# Patient Record
Sex: Female | Born: 1980 | Hispanic: Yes | Marital: Single | Smoking: Never smoker
Health system: Southern US, Community
[De-identification: ages and names within clinical notes are randomized; demographics above are authoritative.]

## PROBLEM LIST (undated history)

## (undated) DIAGNOSIS — N83209 Unspecified ovarian cyst, unspecified side: Secondary | ICD-10-CM

## (undated) HISTORY — PX: CHOLECYSTECTOMY: SHX55

---

## 2004-12-03 ENCOUNTER — Ambulatory Visit: Payer: Self-pay | Admitting: Family Medicine

## 2005-01-08 ENCOUNTER — Ambulatory Visit: Payer: Self-pay | Admitting: Family Medicine

## 2005-01-27 ENCOUNTER — Observation Stay: Payer: Self-pay

## 2005-06-06 ENCOUNTER — Inpatient Hospital Stay: Payer: Self-pay

## 2005-06-08 IMAGING — US US OB US >=[ID] SNGL FETUS
1 series · 14 of 28 positions shown · non-contrast
Comparison: none

REASON FOR EXAM: Anatomy and Growth
COMMENTS:

[Series 1: us ob us >=(id) sngl fetus · 0.39mm/px · 14 of 66 slices shown]
[im 3/66]
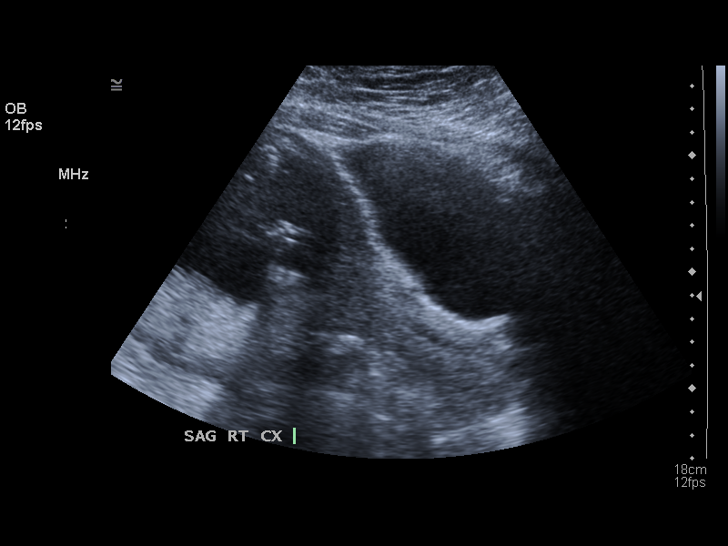
[im 8/66]
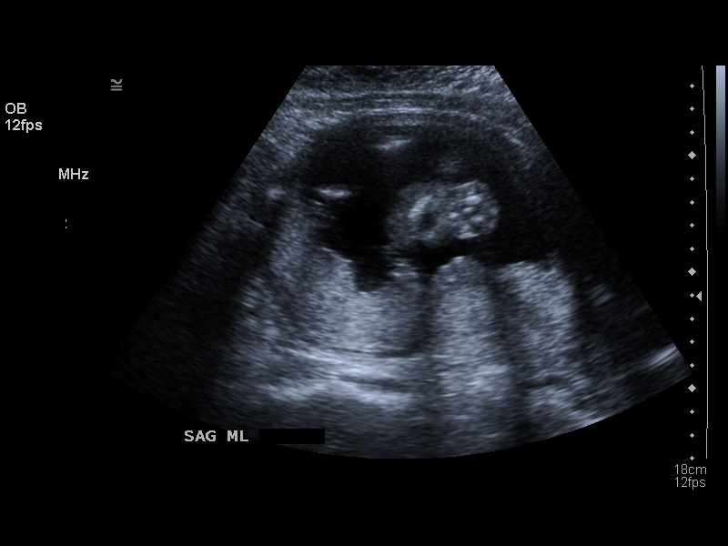
[im 13/66]
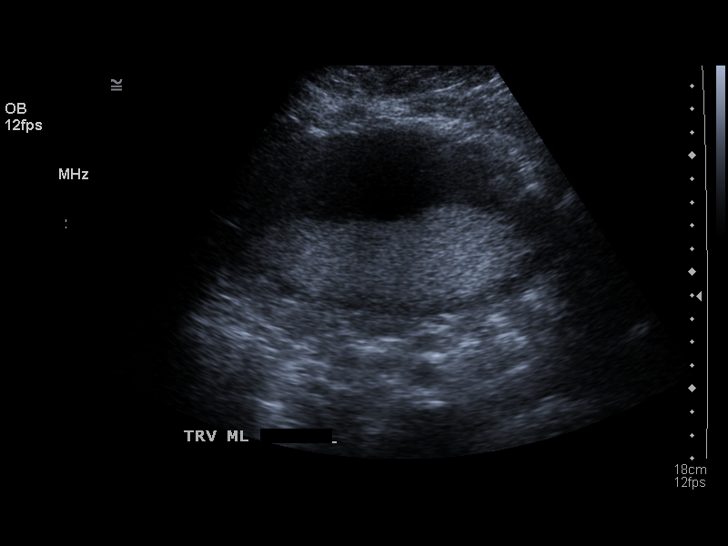
[im 17/66]
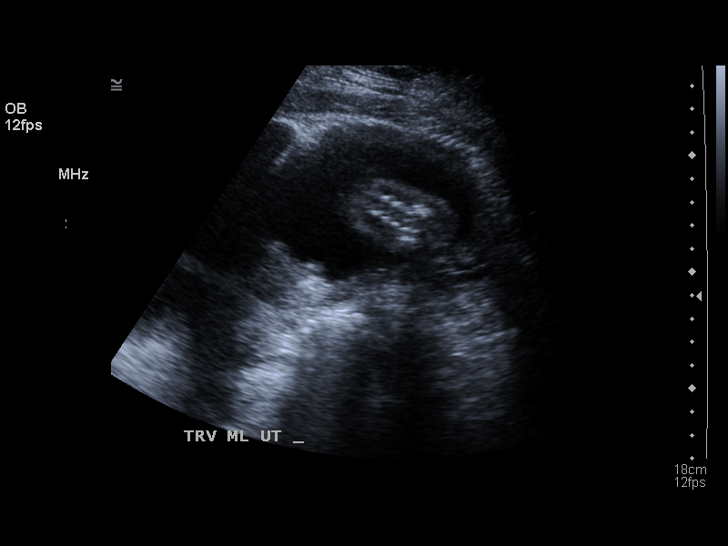
[im 22/66]
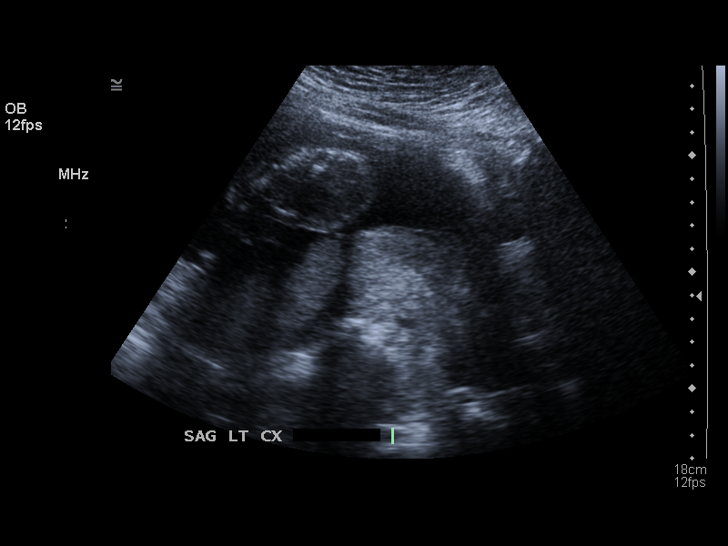
[im 27/66]
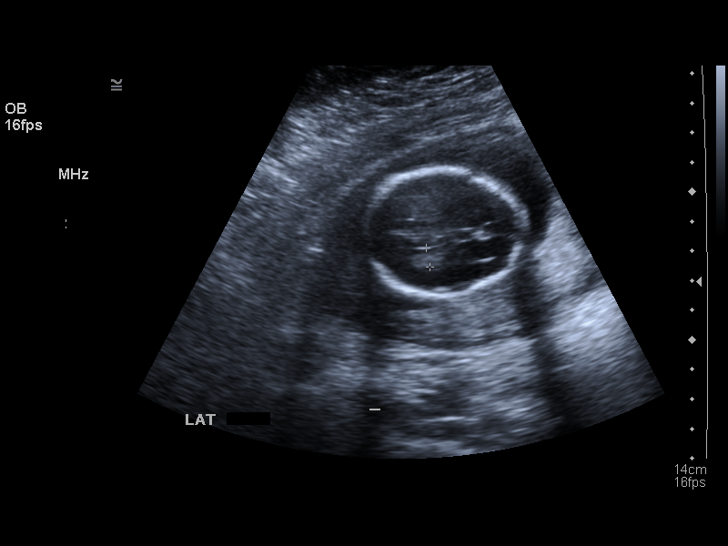
[im 32/66]
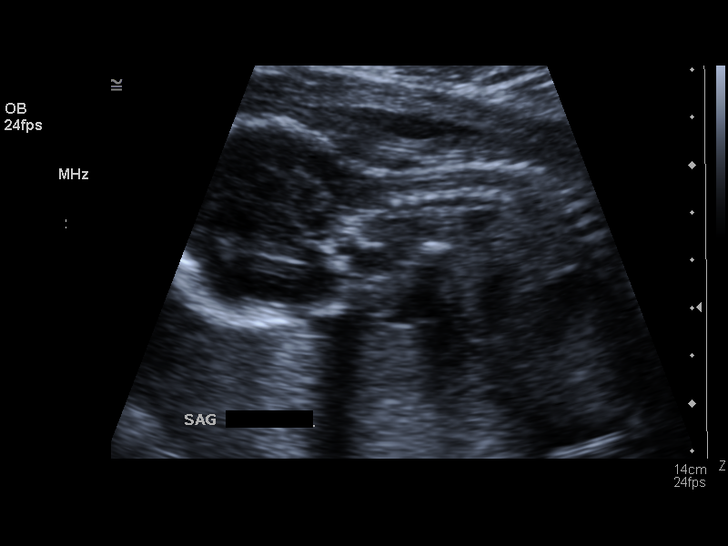
[im 37/66]
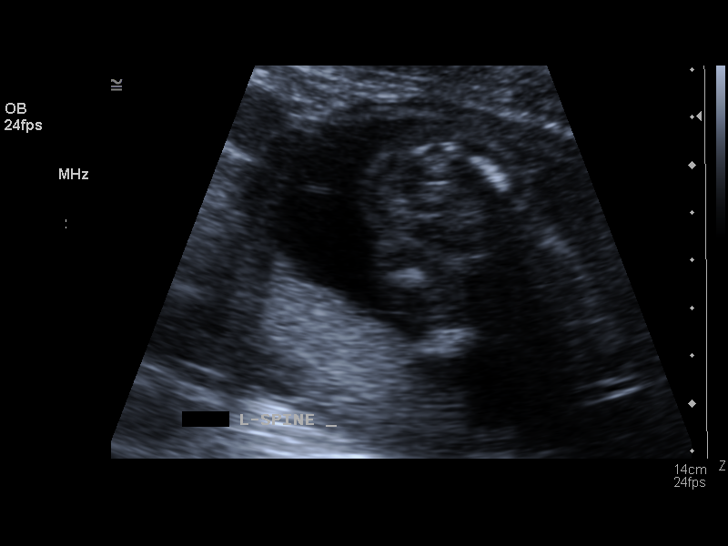
[im 41/66]
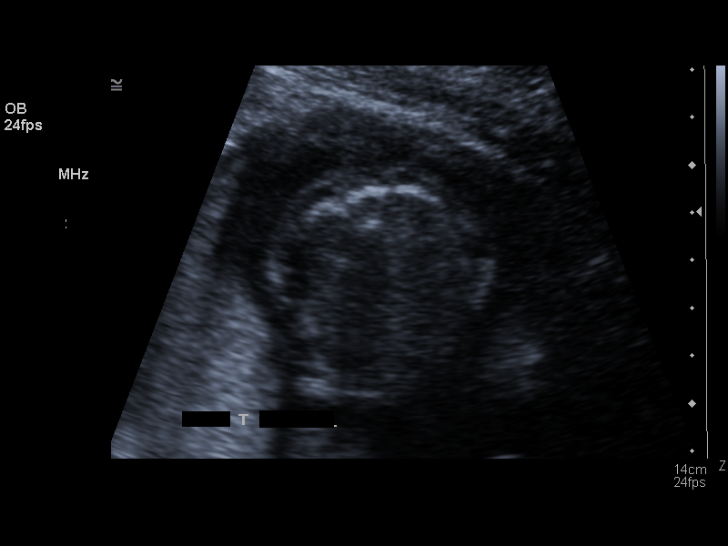
[im 46/66]
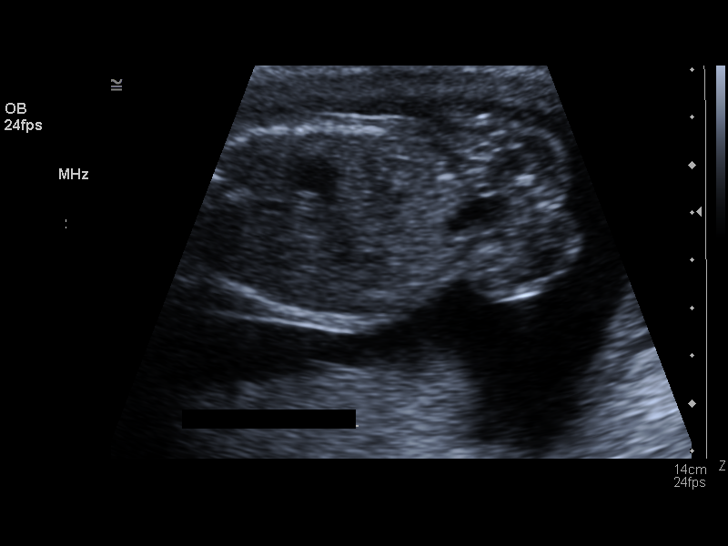
[im 51/66]
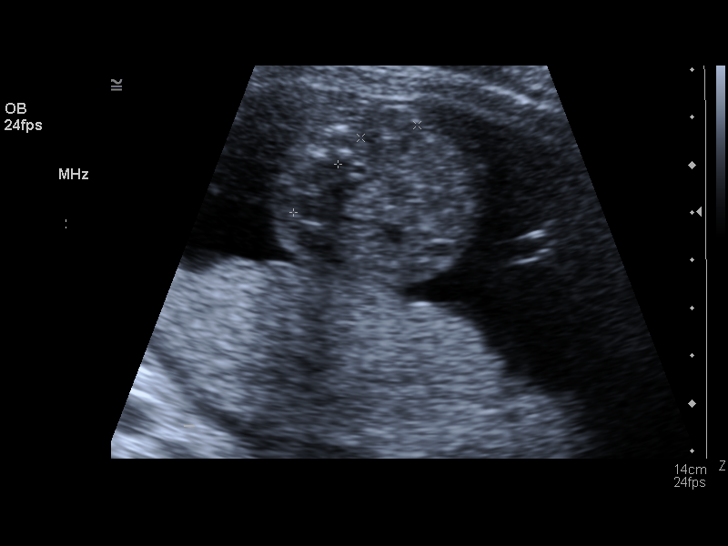
[im 56/66]
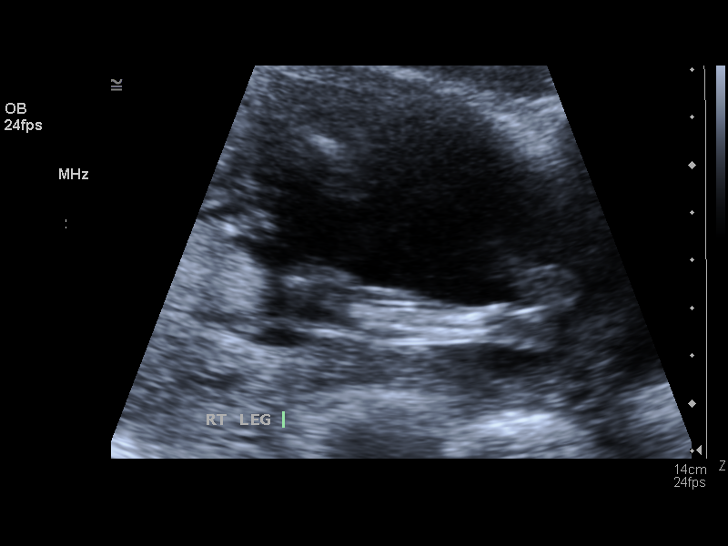
[im 61/66]
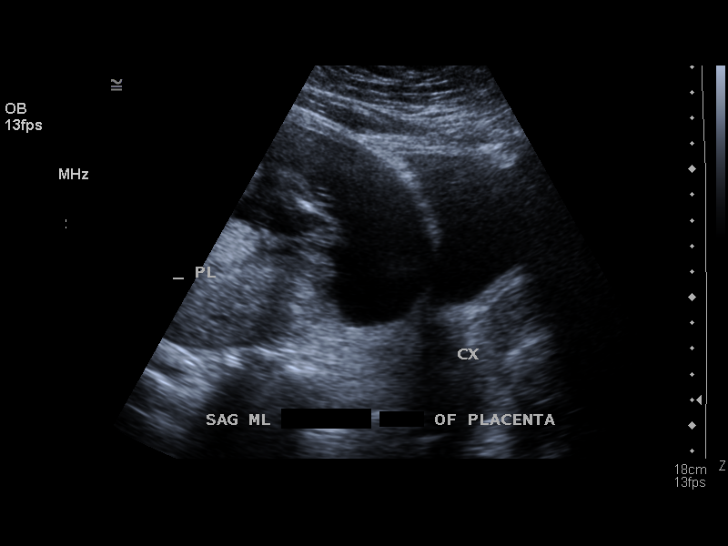
[im 66/66]
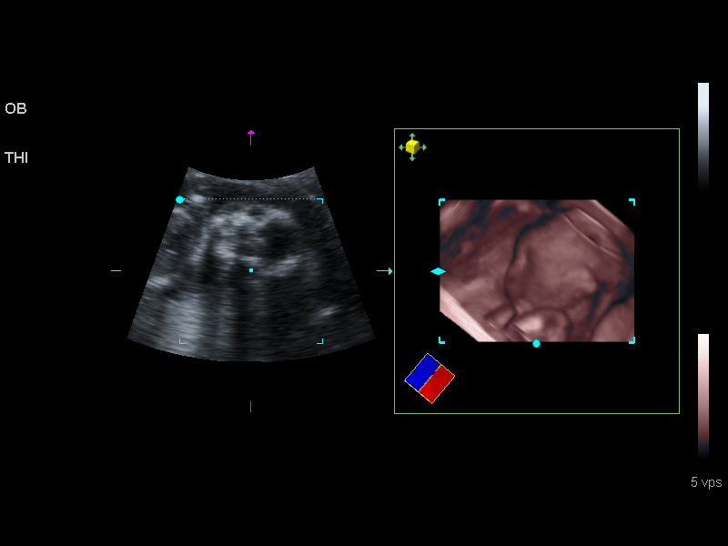

[14 of 28 positions shown; findings below may reference images not displayed]

PROCEDURE:     US  - US PREGNANCY / FETAL AGE  - January 08, 2005 [DATE]

RESULT:        Fetal parts of a single viable fetus are visualized.
Presentation is variable during this exam.  The placenta is posterior.
Fetal heart rate was monitored at 144 beats per minute.  The fetal heart,
stomach and urinary bladder are visualized.  Amniotic fluid volume appears
normal.  Fetal measurements are as follows:

BPD          44.3 mm     19 weeks 3 days
HC               161.6 mm     19 weeks 0 days
AC               140.5 mm     19 weeks 3 days
FL                31.8 mm     19 weeks 6 days
EFW equal 301 grams.  Average ultrasound age 19 weeks 3 days.  Ultrasound
EDD 06/01/05.
IMPRESSION: Please see above.

## 2009-09-25 ENCOUNTER — Emergency Department: Payer: Self-pay | Admitting: Emergency Medicine

## 2009-09-27 ENCOUNTER — Ambulatory Visit: Payer: Self-pay | Admitting: Surgery

## 2011-09-26 ENCOUNTER — Ambulatory Visit: Payer: Self-pay | Admitting: Family Medicine

## 2012-03-09 ENCOUNTER — Inpatient Hospital Stay: Payer: Self-pay

## 2012-03-09 LAB — CBC WITH DIFFERENTIAL/PLATELET
Basophil #: 0 10*3/uL (ref 0.0–0.1)
Eosinophil %: 0.9 %
HCT: 42.7 % (ref 35.0–47.0)
HGB: 14.5 g/dL (ref 12.0–16.0)
MCH: 30.3 pg (ref 26.0–34.0)
MCHC: 34 g/dL (ref 32.0–36.0)
Monocyte #: 0.8 x10 3/mm (ref 0.2–0.9)
Monocyte %: 7.7 %
Neutrophil #: 7.6 10*3/uL — ABNORMAL HIGH (ref 1.4–6.5)
Neutrophil %: 69.7 %
WBC: 10.9 10*3/uL (ref 3.6–11.0)

## 2012-03-10 LAB — HEMATOCRIT: HCT: 39.5 % (ref 35.0–47.0)

## 2015-03-21 NOTE — H&P (Signed)
L&D Evaluation:  History:   HPI [id number] nwith LMP of 05/17/11 & EDd of 02/21/12 here for "UC's becoming more uncomfortable and sent [facility]ida [facility] Pt initially was 4 cms in the office and now  is 6 cm's progressing over the past 2 hours. ROM has not occurred. PNC at [facility] significant for Depression, ASCUS pap, Obesity. No VB or decreased FM noted.    Presents with contractions    Patient's Medical History Depression, ASCUS, Obesity, Bialt ovarian masses 2004, Trich x 1    Patient's Surgical History none    Medications Pre [place] Vitamins    Social History former smoker    Family History Non-Contributory   ROS:   ROS All systems were reviewed.  HEENT, CNS, GI, GU, Respiratory, CV, Renal and Musculoskeletal systems were found to be normal.   Exam:   Vital Signs stable    General no apparent distress    Mental Status clear    Chest clear    Heart normal sinus rhythm, no murmur/gallop/rubs    Abdomen gravid, tender with contractions    Estimated Fetal Weight Average for gestational age    Back no CVAT    Edema 1+    Reflexes 1+    Clonus negative    Pelvic 6/100/vtx    Mebranes Intact    FHT normal rate with no decels, + accels, minimal variability now    Ucx irregular, UC's are not showing    Skin dry    Lymph no lymphadenopathy   Impression:   Impression active labor, IUP at 40 4/7 weeks with active labor   Plan:   Plan monitor contractions and for cervical change   Electronic Signatures: Catheryn Bacon (CNM)  (Signed 29-Apr-13 12:41)  Authored: L&D Evaluation   Last Updated: 29-Apr-13 12:41 by Catheryn Bacon (CNM)

## 2015-07-06 ENCOUNTER — Emergency Department: Payer: Self-pay

## 2015-07-06 ENCOUNTER — Encounter: Payer: Self-pay | Admitting: Emergency Medicine

## 2015-07-06 ENCOUNTER — Emergency Department
Admission: EM | Admit: 2015-07-06 | Discharge: 2015-07-06 | Disposition: A | Discharge status: Home / Self Care | Payer: Self-pay | Attending: Emergency Medicine | Admitting: Emergency Medicine

## 2015-07-06 DIAGNOSIS — D271 Benign neoplasm of left ovary: Secondary | ICD-10-CM

## 2015-07-06 DIAGNOSIS — Z3202 Encounter for pregnancy test, result negative: Secondary | ICD-10-CM | POA: Insufficient documentation

## 2015-07-06 DIAGNOSIS — Z9049 Acquired absence of other specified parts of digestive tract: Secondary | ICD-10-CM | POA: Insufficient documentation

## 2015-07-06 DIAGNOSIS — D270 Benign neoplasm of right ovary: Secondary | ICD-10-CM

## 2015-07-06 DIAGNOSIS — R1032 Left lower quadrant pain: Secondary | ICD-10-CM

## 2015-07-06 DIAGNOSIS — D279 Benign neoplasm of unspecified ovary: Secondary | ICD-10-CM | POA: Insufficient documentation

## 2015-07-06 LAB — CBC WITH DIFFERENTIAL/PLATELET
Basophils Absolute: 0.1 10*3/uL (ref 0–0.1)
Basophils Relative: 1 %
Eosinophils Absolute: 0.2 10*3/uL (ref 0–0.7)
Eosinophils Relative: 2 %
HEMATOCRIT: 40.1 % (ref 35.0–47.0)
HEMOGLOBIN: 13.4 g/dL (ref 12.0–16.0)
Lymphocytes Relative: 33 %
Lymphs Abs: 3.7 10*3/uL — ABNORMAL HIGH (ref 1.0–3.6)
MCH: 28.5 pg (ref 26.0–34.0)
MCHC: 33.4 g/dL (ref 32.0–36.0)
MCV: 85.2 fL (ref 80.0–100.0)
MONOS PCT: 6 %
Monocytes Absolute: 0.6 10*3/uL (ref 0.2–0.9)
NEUTROS ABS: 6.4 10*3/uL (ref 1.4–6.5)
NEUTROS PCT: 58 %
Platelets: 314 10*3/uL (ref 150–440)
RBC: 4.7 MIL/uL (ref 3.80–5.20)
RDW: 12.4 % (ref 11.5–14.5)
WBC: 11 10*3/uL (ref 3.6–11.0)

## 2015-07-06 LAB — URINALYSIS COMPLETE WITH MICROSCOPIC (ARMC ONLY)
BACTERIA UA: NONE SEEN
Bilirubin Urine: NEGATIVE
GLUCOSE, UA: NEGATIVE mg/dL
LEUKOCYTES UA: NEGATIVE
NITRITE: NEGATIVE
Protein, ur: NEGATIVE mg/dL
SPECIFIC GRAVITY, URINE: 1.014 (ref 1.005–1.030)
pH: 6 (ref 5.0–8.0)

## 2015-07-06 LAB — COMPREHENSIVE METABOLIC PANEL
ALBUMIN: 4.5 g/dL (ref 3.5–5.0)
ALK PHOS: 60 U/L (ref 38–126)
ALT: 14 U/L (ref 14–54)
AST: 28 U/L (ref 15–41)
Anion gap: 12 (ref 5–15)
BILIRUBIN TOTAL: 0.8 mg/dL (ref 0.3–1.2)
BUN: 10 mg/dL (ref 6–20)
CALCIUM: 9.4 mg/dL (ref 8.9–10.3)
CO2: 22 mmol/L (ref 22–32)
CREATININE: 0.56 mg/dL (ref 0.44–1.00)
Chloride: 106 mmol/L (ref 101–111)
GFR calc non Af Amer: >60 mL/min (ref >60)
GLUCOSE: 107 mg/dL — AB (ref 65–99)
Potassium: 3.4 mmol/L — ABNORMAL LOW (ref 3.5–5.1)
Sodium: 140 mmol/L (ref 135–145)
Total Protein: 8.1 g/dL (ref 6.5–8.1)

## 2015-07-06 LAB — WET PREP, GENITAL
Clue Cells Wet Prep HPF POC: NONE SEEN
TRICH WET PREP: NONE SEEN

## 2015-07-06 LAB — CHLAMYDIA/NGC RT PCR (ARMC ONLY)
Chlamydia Tr: NOT DETECTED
N gonorrhoeae: NOT DETECTED

## 2015-07-06 LAB — HCG, QUANTITATIVE, PREGNANCY: hCG, Beta Chain, Quant, S: 1 m[IU]/mL (ref <5)

## 2015-07-06 MED ORDER — SODIUM CHLORIDE 0.9 % IV BOLUS (SEPSIS)
1000.0000 mL | Freq: Once | INTRAVENOUS | Status: AC
  Administered 2015-07-06: 1000 mL via INTRAVENOUS

## 2015-07-06 MED ORDER — HYDROMORPHONE HCL 1 MG/ML IJ SOLN
INTRAMUSCULAR | Status: AC
  Administered 2015-07-06: 1 mg via INTRAVENOUS
  Filled 2015-07-06: qty 1

## 2015-07-06 MED ORDER — IOHEXOL 240 MG/ML SOLN
25.0000 mL | Freq: Once | INTRAMUSCULAR | Status: AC | PRN
  Administered 2015-07-06: 25 mL via ORAL

## 2015-07-06 MED ORDER — MORPHINE SULFATE (PF) 4 MG/ML IV SOLN
INTRAVENOUS | Status: AC
  Administered 2015-07-06: 4 mg via INTRAVENOUS
  Filled 2015-07-06: qty 1

## 2015-07-06 MED ORDER — NAPROXEN 500 MG PO TABS: 500.0000 mg | ORAL_TABLET | Freq: Two times a day (BID) | ORAL | Status: DC

## 2015-07-06 MED ORDER — ONDANSETRON HCL 4 MG/2ML IJ SOLN: 4.0000 mg | Freq: Once | INTRAMUSCULAR | Status: DC

## 2015-07-06 MED ORDER — ONDANSETRON HCL 4 MG/2ML IJ SOLN
INTRAMUSCULAR | Status: AC
  Filled 2015-07-06: qty 2

## 2015-07-06 MED ORDER — MORPHINE SULFATE (PF) 4 MG/ML IV SOLN
4.0000 mg | Freq: Once | INTRAVENOUS | Status: AC
  Administered 2015-07-06: 4 mg via INTRAVENOUS

## 2015-07-06 MED ORDER — IOHEXOL 300 MG/ML  SOLN
100.0000 mL | Freq: Once | INTRAMUSCULAR | Status: AC | PRN
  Administered 2015-07-06: 100 mL via INTRAVENOUS

## 2015-07-06 MED ORDER — PROMETHAZINE HCL 25 MG PO TABS: 25.0000 mg | ORAL_TABLET | Freq: Four times a day (QID) | ORAL | Status: DC | PRN

## 2015-07-06 MED ORDER — HYDROMORPHONE HCL 1 MG/ML IJ SOLN
1.0000 mg | Freq: Once | INTRAMUSCULAR | Status: AC
  Administered 2015-07-06: 1 mg via INTRAVENOUS

## 2015-07-06 NOTE — Discharge Instructions (Signed)
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 You will need to have your primary care physician follow-up on this with a likely MRI.  Follow up with gynecology if you are unable to see your primary care doctor or your symptoms do not improve.  Your CT scan does not show any other significant concerns.   Dolor abdominal en las mujeres (Abdominal Pain, Women) El dolor abdominal (en el estmago, la pelvis o el vientre) puede tener muchas causas. Es importante que le informe a su mdico:  La ubicacin del Social research officer, government.  Viene y se va, o persiste todo el tiempo?  Hay situaciones que English as a second language teacher (comer ciertos alimentos, la actividad fsica)?  Tiene otros sntomas asociados al dolor (fiebre, nuseas, vmitos, diarrea)? Todo es de gran ayuda cuando se trata de hallar la causa del dolor. CAUSAS  Estmago: Infecciones por virus o bacterias, o lcera.  Intestino: Apendicitis (apndice inflamado), ileitis regional (enfermedad de Crohn), colitis ulcerosa (colon inflamado), sndrome del colon irritable, diverticulitis (inflamacin de los divertculos del colon) o cncer de estmago oo intestino.  Enfermedades de la vescula biliar o clculos.  Enfermedades renales, clculos o infecciones en el rin.  Infeccin o cncer del pncreas.  Fibromialgia (trastorno doloroso)  Enfermedades de los rganos femeninos:  Uterus: tero: fibroma (tumor no canceroso) o infeccin  Trompas de [place]: infeccin o embarazo ectpico  En los ovarios, quistes o tumores.  Adherencias plvicas (tejido cicatrizal).  Endometriosis (el tejido que cubre el tero se desarrolla en la pelvis y los rganos plvicos).  Sndrome de Occupational psychologist (los rganos femeninos se llenan de sangre antes del periodo menstrual(  Dolor durante el periodo menstrual.  Dolor durante la ovulacin (al producir vulos).  Dolor al usar el DIU (dispositivo intrauterino para el control de la natalidad)  Administrator, arts los rganos femeninos.  Dolor funcional (no est originado en una enfermedad, puede mejorar sin tratamiento).  Dolor de origen psicolgico  Depresin. DIAGNSTICO Su mdico decidir la gravedad del dolor a travs del examen fsico  Anlisis de sangre  Radiografas  Ecografas  TC (tomografa computada, tipo especial de radiografas).  IMR (resonancia magntica)  Cultivos, en el caso una infeccin  Colon por enema de bario (se inserta una sustancia de contraste en el intestino grueso para mejorar la observacin con rayos X.)  Colonoscopa (observacin del intestino con un tubo luminoso).  Laparoscopa (examen del interior del abdomen con un tubo que tiene [employer]).  Ciruga exploratoria abdominal mayor (se observa el abdomen realizando una gran incisin). TRATAMIENTO El tratamiento depender de la causa del problema.   Muchos de estos casos pueden controlarse y tratarse en casa.  Medicamentos de venta libre indicados por el mdico.  Medicamentos con receta.  Antibiticos, en caso de infeccin  Pldoras anticonceptivas, en el caso de perodos dolorosos o dolor al ovular.  Tratamiento hormonal, para la endometriosis  Inyecciones para bloqueo nervioso selectivo.  Fisioterapia.  Antidepresivos.  Consejos por parte de un psclogo o psiquiatra.  Ciruga mayor o menor. INSTRUCCIONES PARA EL CUIDADO DOMICILIARIO  No tome ni administre laxantes a menos que se lo haya indicado su mdico.  Tome analgsicos de venta libre slo si se lo ha indicado el profesional que lo asiste. No tome aspirina, ya que puede causar [employer] o hemorragias.  Consuma una dieta lquida (caldo o agua) segn lo indicado por el mdico. Progrese lentamente a una dieta blanda, segn la tolerancia, si el dolor se relaciona con el estmago o el  intestino.  Tenga un termmetro y tmese la temperatura varias veces al da.  Haga reposo en la cama y [place], si esto Dealer.  Evite las relaciones sexuales, Higher education careers adviser.  Evite las situaciones estresantes.  Cumpla con las visitas y los anlisis de control, segn las indicaciones de su mdico.  Si el dolor no se [place] con los medicamentos o la Outlook, [place] tratar con:  Acupuntura.  Ejercicios de relajacin (yoga, meditacin).  Terapia grupal.  Psicoterapia. SOLICITE ATENCIN MDICA SI:  Nota que ciertos Writer de [place].  El tratamiento indicado para Lexicographer no Engineer, civil (consulting).  Necesita analgsicos ms fuertes.  Quiere que le retiren el DIU.  Si se siente confundido o desfalleciente.  Presenta nuseas o vmitos.  Aparece una erupcin cutnea.  Sufre efectos adversos o una reaccin alrgica debido a los medicamentos que toma. SOLICITE ATENCIN MDICA DE INMEDIATO SI:  El dolor persiste o se agrava.  Tiene fiebre.  Siente el dolor slo en algunos sectores del abdomen. Si se localiza en la zona derecha, posiblemente podra tratarse de apendicitis. En un adulto, si se localiza en la regin inferior izquierda del abdomen, podra tratarse de colitis o diverticulitis.  Hay sangre en las heces (deposiciones de color rojo brillante o negro alquitranado), con o sin vmitos.  Usted presenta sangre en la orina.  Siente escalofros con o sin fiebre.  Se desmaya. ASEGRESE QUE:   Comprende estas instrucciones.  Controlar su enfermedad.  Solicitar ayuda de inmediato si no mejora o si empeora. Document Released: 02/13/2009 Document Revised: 01/20/2012 [facility] Patient Information 2015 [place]. This information is not intended to replace advice given to you by your health care provider. Make sure you discuss any questions you have with your health care provider.  Quiste ovrico (Ovarian Cyst) Un quiste ovrico es una bolsa llena de lquido que se forma en el ovario. Los ovarios son los rganos pequeos que producen vulos en las  mujeres. Se pueden formar varios tipos de [employer]. Benito Mccreedy no son cancerosos. Muchos de ellos no causan problemas y con frecuencia desaparecen solos. Algunos pueden provocar sntomas y requerir Clinical research associate. Los tipos ms comunes de quistes ovricos son los siguientes:  Quistes funcionales: estos quistes pueden aparecer todos los meses durante el ciclo menstrual. Esto es normal. Estos quistes suelen desaparecer con el prximo ciclo menstrual si la mujer no queda embarazada. En general, los quistes funcionales no tienen sntomas.  Endometriomas: estos quistes se forman a partir del tejido que recubre el tero. Tambin se denominan "quistes de chocolate" porque se llenan de sangre que se vuelve marrn. Este tipo de quiste puede Engineer, production en la zona inferior del abdomen durante la relacin sexual y con el perodo menstrual.  Cistoadenomas: este tipo se desarrolla a partir de las clulas que se [place] en el exterior del ovario. Estos quistes pueden ser muy grandes y causar dolor en la zona inferior del abdomen y durante la relacin sexual. Cindra Presume tipo de quiste puede girar sobre s mismo, cortar el suministro de Biochemist, clinical y causar un dolor intenso. Tambin se puede romper con facilidad y Stage manager.  Quistes dermoides: este tipo de quiste a veces se encuentra en ambos ovarios. Estos quistes pueden [employer] tipos de tejidos del organismo, como piel, dientes, pelo o Database administrator. Generalmente no tienen sntomas, a menos que sean muy grandes.  Quistes tecalutenicos: aparecen cuando se produce demasiada cantidad de cierta hormona (gonadotropina  corinica humana) que estimula en exceso al ovario para que produzca vulos. Esto es ms frecuente despus de procedimientos que ayudan a la concepcin de un beb (fertilizacin in vitro). CAUSAS   Los medicamentos para la fertilidad pueden provocar una afeccin mediante la cual se forman mltiples quistes de gran tamao en los  ovarios. Esta se denomina sndrome de hiperestimulacin ovrica.  El sndrome del ovario poliqustico es una afeccin que puede causar desequilibrios hormonales, los cuales pueden dar como resultado quistes ovricos no funcionales. SIGNOS Y SNTOMAS  Muchos quistes ovricos no causan sntomas. Si se presentan sntomas, stos pueden ser:  Dolor o molestias en la pelvis.  Dolor en la parte baja del abdomen.  [place].  Aumento del permetro abdominal (hinchazn).  Perodos menstruales anormales.  Aumento del [employer] perodos [place].  Cese de los perodos menstruales sin estar embarazada. DIAGNSTICO  Estos quistes se descubren comnmente durante un examen de rutina o una exploracin ginecolgica anual. Es posible que se ordenen otros estudios para obtener ms informacin sobre el [place]. Estos estudios pueden ser:  Engineer, materials.  Radiografas de la pelvis.  Tomografa computada.  Resonancia magntica.  Anlisis de [place]. TRATAMIENTO  Muchos de los quistes ovricos desaparecen por s solos, sin tratamiento. Es probable que el mdico quiera controlar el quiste regularmente durante 2 o 50meses para ver si se produce algn cambio. En el caso de las mujeres en la menopausia, es particularmente importante controlar de cerca al quiste ya que el ndice de cncer de ovario en las mujeres menopusicas es ms alto. Cuando se requiere Clinical research associate, este puede incluir cualquiera de los siguientes:  Un procedimiento para drenar el quiste (aspiracin). Esto se puede realizar [employer] uso de [place] grande y [place]. Tambin se puede hacer a travs de un procedimiento laparoscpico, En este procedimiento, se inserta un tubo delgado que emite luz y que tiene una pequea cmara en un extremo (laparoscopio) a travs de una pequea incisin.  Ciruga para extirpar el quiste completo. Esto se puede realizar mediante una ciruga laparoscpica o Ardelia Mems ciruga  abierta, la cual implica realizar una incisin ms grande en la parte inferior del abdomen.  Tratamiento hormonal o pldoras anticonceptivas. Estos mtodos a veces se usan para ayudar a Writer. [place] solo medicamentos de venta libre o recetados, segn las indicaciones del mdico.  Consulting civil engineer a las consultas de control con su mdico segn las indicaciones.  Hgase exmenes plvicos regulares y pruebas de Papanicolaou. SOLICITE ATENCIN MDICA SI:   Los perodos se atrasan, son irregulares, dolorosos o cesan.  El dolor plvico o abdominal no desaparece.  El abdomen se agranda o se hincha.  Siente presin en la vejiga o no puede vaciarla completamente.  Siente dolor durante las [employer].  Tiene una sensacin de hinchazn, presin o Manufacturing systems engineer.  Pierde peso sin razn aparente.  Siente un Pharmacist, hospital.  Est estreida.  Pierde el apetito.  Le aparece acn.  Nota un aumento del vello corporal y facial.  Elenore Rota de peso sin hacer modificaciones en su actividad fsica y en su dieta habitual.  Sospecha que est embarazada. SOLICITE ATENCIN MDICA DE INMEDIATO SI:   Siente cada vez ms dolor abdominal.  Tiene malestar estomacal (nuseas) y vomita.  Tiene fiebre que se presenta de [place] repentina.  Siente dolor abdominal al defecar.  Sus perodos menstruales son ms abundantes que lo habitual. ASEGRESE DE QUE:  Comprende estas instrucciones.  Controlar su afeccin.  Recibir ayuda de inmediato si no mejora o si empeora. Document Released: 08/07/2005 Document Revised: 11/02/2013 [facility] Patient Information 2015 [place]. This information is not intended to replace advice given to you by your health care provider. Make sure you discuss any questions you have with your health care provider.

## 2015-07-06 NOTE — ED Notes (Signed)
Says left low quad pain stared today.  Pt is moaning, holdingl ow abdomen.  Left side

## 2015-07-06 NOTE — ED Provider Notes (Signed)
-----------------------------------------   5:47 PM on 07/06/2015 -----------------------------------------  CT complete, no new findings. Patient remains hemodynamically stable with pain controlled. We'll discharge home with prescriptions for symptom relief and instructions to follow-up with primary care and gynecology.  Carrie Mew, MD 07/06/15 [phone]

## 2015-07-06 NOTE — ED Provider Notes (Signed)
[facility] Emergency Department Provider Note   ____________________________________________  Time seen: 10:15 AM I have reviewed the triage vital signs and the triage nursing note.  HISTORY  Chief Complaint Abdominal Pain   Historian Patient, history through a Spanish interpreter  HPI Brittany Torres is a 34 y.o. female with no significant past medical history except for cholecystectomy, who had acute onset of left lower quadrant and left flank pain at around 7:30 this morning upon waking. It's been constant and severe since that time. She never felt this before. No specific urinary symptoms. Yesterday she did have some vaginal bleeding, she does not think that it was urinary. She had a normal period around August 11. No prior kidney stone history. Denies vaginal discharge. Denies constipation or diarrhea. Denies fever.    History reviewed. No pertinent past medical history.  There are no active problems to display for this patient.   Past Surgical History  Procedure Laterality Date  . Cholecystectomy      No current outpatient prescriptions on file.  Allergies Review of patient's allergies indicates no known allergies.  No family history on file.  Social History Social History  Substance Use Topics  . Smoking status: Never Smoker   . Smokeless tobacco: None  . Alcohol Use: No    Review of Systems  Constitutional: Negative for fever. Eyes: Negative for visual changes. ENT: Negative for sore throat. Cardiovascular: Negative for chest pain. Respiratory: Negative for shortness of breath. Gastrointestinal: Positive for nausea and left lower quadrant pain as per history of present illness Genitourinary: Negative for dysuria. Musculoskeletal: Negative for extreme pain. Positive left flank pain.. Skin: Negative for rash. Neurological: Negative for headache. 10 point Review of Systems otherwise  negative ____________________________________________   PHYSICAL EXAM:  VITAL SIGNS: ED Triage Vitals  Enc Vitals Group     BP 07/06/15 0956 136/94 mmHg     Pulse Rate 07/06/15 0956 59     Resp 07/06/15 0956 24     Temp 07/06/15 0956 97.6 F (36.4 C)     Temp Source 07/06/15 0956 Oral     SpO2 07/06/15 0956 100 %     Weight --      Height 07/06/15 0956 5' (1.524 m)     Head Cir --      Peak Flow --      Pain Score 07/06/15 1002 10     Pain Loc --      Pain Edu? --      Excl. in [place]? --      Constitutional: Alert and oriented. In moderate distress writhing around in pain holding the left lower abdomen. Eyes: Conjunctivae are normal. PERRL. Normal extraocular movements. ENT   Head: Normocephalic and atraumatic.   Nose: No congestion/rhinnorhea.   Mouth/Throat: Mucous membranes are moist.   Neck: No stridor. Cardiovascular/Chest: Normal rate, regular rhythm.  No murmurs, rubs, or gallops. Respiratory: Normal respiratory effort without tachypnea nor retractions. Breath sounds are clear and equal bilaterally. No wheezes/rales/rhonchi. Gastrointestinal: Soft. No distention.  No McBurney point tenderness.Repeat her tenderness. Moderate tenderness in the left lower quadrant with mild guarding.  Genitourinary/rectal: Small red discharge, no cervicitis. No pelvic mass. Musculoskeletal: Nontender with normal range of motion in all extremities. No joint effusions.  No lower extremity tenderness.  No edema. Neurologic:  Normal speech and language. No gross or focal neurologic deficits are appreciated. Skin:  Skin is warm, dry and intact. No rash noted. Psychiatric: Mood and affect are normal. Speech and  behavior are normal. Patient exhibits appropriate insight and judgment.  ____________________________________________   EKG I, Lisa Roca, MD, the attending physician have personally viewed and interpreted all ECGs.  No EKG  performed ____________________________________________  LABS (pertinent positives/negatives)  Urinalysis negative for blood, or signs of urinary tract infection Up with the count 11.0 with no shift. Hemoglobin 14.4 Complete metabolic panel without significant abnormality Process negative  ____________________________________________  RADIOLOGY All Xrays were viewed by me. Imaging interpreted by Radiologist.  Ultrasound pelvis and transvaginal with vascular flow:  IMPRESSION: 1. No normal ovarian parenchyma is identified. 2. There are bilateral adnexal complex hypoechoic masses with areas of echogenicity and posterior shadowing. 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 Since these may be difficult to assess completely with [place], further evaluation of simple-appearing cysts >7 cm with MRI or surgical evaluation is recommended according to the Society of Radiologists in Ultrasound 2010 Consensus Conference Statement (D Clovis Riley et al. Management of Asymptomatic Ovarian and other Adnexal Cysts Imaged at [place]: Society of Radiologists in [place] Statement 2010. Radiology 256 (Sept 2010): 315-400.).  CT abdomen and pelvis with contrast: Pending __________________________________________  PROCEDURES  Procedure(s) performed: None  Critical Care performed: None  ____________________________________________   ED COURSE / ASSESSMENT AND PLAN  CONSULTATIONS: None  Pertinent labs & imaging results that were available during my care of the patient were reviewed by me and considered in my medical decision making (see chart for details).  Patient is in significant discomfort upon arrival and is writhing around on the bed making me most suspicious clinically for kidney stone. However she is denying any urinary symptoms, and she is a little more tender on abdominal palpation than I would suspect for kidney stone, and so in order to evaluate the ovary  for ovarian torsion, I would have her go for ultrasound first as this would be the most time urgent issue.  ----------------------------------------- 2:51 PM on 07/06/2015 -----------------------------------------   Patient's pain is much better but she is still significantly tender on palpation Simon to go forward with a CT scan of the abdomen and pelvis. Urinalysis shows no evidence of this is urinary tract infection, or hematuria for stone. The ultrasound showed possible dermoid cysts bilaterally, and I discussed this with the patient and told her she needs follow-up with her primary care physician with a likely MRI.  Pelvic exam shows no couple cervicitis and prep is negative.  Patient care transferred at shift change 3 PM to Dr. Joni Fears. CT scan abdomen is pending. I suspect the patient could go home if this shows no significant medical surgical emergency.  Patient / Family / Caregiver informed of clinical course, medical decision-making process, and agree with plan.    ___________________________________________   FINAL CLINICAL IMPRESSION(S) / ED DIAGNOSES   Final diagnoses:  Left lower quadrant pain       Lisa Roca, MD 07/06/15 1529

## 2015-11-26 ENCOUNTER — Emergency Department: Payer: Medicaid Other

## 2015-11-26 ENCOUNTER — Encounter: Payer: Self-pay | Admitting: Emergency Medicine

## 2015-11-26 ENCOUNTER — Encounter
Admission: EM | Disposition: A | Discharge status: Home / Self Care | Payer: Self-pay | Source: Home / Self Care | Attending: Emergency Medicine

## 2015-11-26 ENCOUNTER — Emergency Department: Payer: Medicaid Other | Admitting: Anesthesiology

## 2015-11-26 ENCOUNTER — Observation Stay
Admission: EM | Admit: 2015-11-26 | Discharge: 2015-11-27 | Disposition: A | Discharge status: Home / Self Care | Payer: Medicaid Other | Attending: Obstetrics & Gynecology | Admitting: Obstetrics & Gynecology

## 2015-11-26 DIAGNOSIS — N83519 Torsion of ovary and ovarian pedicle, unspecified side: Secondary | ICD-10-CM

## 2015-11-26 DIAGNOSIS — R1032 Left lower quadrant pain: Secondary | ICD-10-CM | POA: Diagnosis not present

## 2015-11-26 DIAGNOSIS — D279 Benign neoplasm of unspecified ovary: Secondary | ICD-10-CM | POA: Diagnosis present

## 2015-11-26 DIAGNOSIS — Z9049 Acquired absence of other specified parts of digestive tract: Secondary | ICD-10-CM | POA: Insufficient documentation

## 2015-11-26 DIAGNOSIS — N83201 Unspecified ovarian cyst, right side: Secondary | ICD-10-CM

## 2015-11-26 DIAGNOSIS — E669 Obesity, unspecified: Secondary | ICD-10-CM | POA: Insufficient documentation

## 2015-11-26 DIAGNOSIS — Z79899 Other long term (current) drug therapy: Secondary | ICD-10-CM | POA: Diagnosis not present

## 2015-11-26 DIAGNOSIS — N83202 Unspecified ovarian cyst, left side: Secondary | ICD-10-CM

## 2015-11-26 DIAGNOSIS — D270 Benign neoplasm of right ovary: Secondary | ICD-10-CM | POA: Insufficient documentation

## 2015-11-26 DIAGNOSIS — R102 Pelvic and perineal pain: Secondary | ICD-10-CM | POA: Diagnosis not present

## 2015-11-26 DIAGNOSIS — D271 Benign neoplasm of left ovary: Principal | ICD-10-CM | POA: Insufficient documentation

## 2015-11-26 DIAGNOSIS — Z6835 Body mass index (BMI) 35.0-35.9, adult: Secondary | ICD-10-CM | POA: Diagnosis not present

## 2015-11-26 DIAGNOSIS — K429 Umbilical hernia without obstruction or gangrene: Secondary | ICD-10-CM | POA: Insufficient documentation

## 2015-11-26 HISTORY — PX: DIAGNOSTIC LAPAROSCOPY WITH REMOVAL OF ECTOPIC PREGNANCY: SHX6449

## 2015-11-26 HISTORY — PX: LAPAROSCOPIC SALPINGO OOPHERECTOMY: SHX5927

## 2015-11-26 HISTORY — DX: Unspecified ovarian cyst, unspecified side: N83.209

## 2015-11-26 HISTORY — PX: UMBILICAL HERNIA REPAIR: SHX196

## 2015-11-26 LAB — URINALYSIS COMPLETE WITH MICROSCOPIC (ARMC ONLY)
BACTERIA UA: NONE SEEN
Bilirubin Urine: NEGATIVE
Glucose, UA: NEGATIVE mg/dL
Nitrite: NEGATIVE
Protein, ur: NEGATIVE mg/dL
Specific Gravity, Urine: 1.016 (ref 1.005–1.030)
pH: 6 (ref 5.0–8.0)

## 2015-11-26 LAB — CBC
HEMATOCRIT: 42.5 % (ref 35.0–47.0)
HEMOGLOBIN: 14.3 g/dL (ref 12.0–16.0)
MCH: 28.8 pg (ref 26.0–34.0)
MCHC: 33.7 g/dL (ref 32.0–36.0)
MCV: 85.3 fL (ref 80.0–100.0)
Platelets: 336 10*3/uL (ref 150–440)
RBC: 4.99 MIL/uL (ref 3.80–5.20)
RDW: 12.7 % (ref 11.5–14.5)
WBC: 10.4 10*3/uL (ref 3.6–11.0)

## 2015-11-26 LAB — WET PREP, GENITAL
Clue Cells Wet Prep HPF POC: NONE SEEN
Sperm: NONE SEEN
TRICH WET PREP: NONE SEEN
YEAST WET PREP: NONE SEEN

## 2015-11-26 LAB — CHLAMYDIA/NGC RT PCR (ARMC ONLY)
CHLAMYDIA TR: NOT DETECTED
N GONORRHOEAE: NOT DETECTED

## 2015-11-26 LAB — COMPREHENSIVE METABOLIC PANEL
ALBUMIN: 4.7 g/dL (ref 3.5–5.0)
ALT: 25 U/L (ref 14–54)
ANION GAP: 11 (ref 5–15)
AST: 24 U/L (ref 15–41)
Alkaline Phosphatase: 72 U/L (ref 38–126)
BUN: 9 mg/dL (ref 6–20)
CO2: 22 mmol/L (ref 22–32)
Calcium: 9.5 mg/dL (ref 8.9–10.3)
Chloride: 104 mmol/L (ref 101–111)
Creatinine, Ser: 0.42 mg/dL — ABNORMAL LOW (ref 0.44–1.00)
GFR calc Af Amer: >60 mL/min (ref >60)
GFR calc non Af Amer: >60 mL/min (ref >60)
GLUCOSE: 106 mg/dL — AB (ref 65–99)
POTASSIUM: 3.5 mmol/L (ref 3.5–5.1)
SODIUM: 137 mmol/L (ref 135–145)
Total Bilirubin: 0.5 mg/dL (ref 0.3–1.2)
Total Protein: 8.5 g/dL — ABNORMAL HIGH (ref 6.5–8.1)

## 2015-11-26 LAB — LIPASE, BLOOD: Lipase: 22 U/L (ref 11–51)

## 2015-11-26 LAB — TYPE AND SCREEN
ABO/RH(D): O POS
Antibody Screen: NEGATIVE

## 2015-11-26 LAB — HCG, QUANTITATIVE, PREGNANCY: hCG, Beta Chain, Quant, S: <1 m[IU]/mL (ref <5)

## 2015-11-26 SURGERY — LAPAROSCOPY, WITH ECTOPIC PREGNANCY SURGICAL TREATMENT
Anesthesia: General | Laterality: Bilateral | Wound class: Clean

## 2015-11-26 MED ORDER — LACTATED RINGERS IV SOLN
INTRAVENOUS | Status: DC | PRN
  Administered 2015-11-26 (×2): via INTRAVENOUS

## 2015-11-26 MED ORDER — FENTANYL CITRATE (PF) 100 MCG/2ML IJ SOLN: 25.0000 ug | INTRAMUSCULAR | Status: DC | PRN

## 2015-11-26 MED ORDER — MORPHINE SULFATE (PF) 4 MG/ML IV SOLN
4.0000 mg | Freq: Once | INTRAVENOUS | Status: AC
  Administered 2015-11-26: 4 mg via INTRAVENOUS

## 2015-11-26 MED ORDER — KETOROLAC TROMETHAMINE 30 MG/ML IJ SOLN
30.0000 mg | Freq: Once | INTRAMUSCULAR | Status: AC
  Administered 2015-11-26: 30 mg via INTRAVENOUS

## 2015-11-26 MED ORDER — MIDAZOLAM HCL 2 MG/2ML IJ SOLN
INTRAMUSCULAR | Status: DC | PRN
  Administered 2015-11-26: 2 mg via INTRAVENOUS

## 2015-11-26 MED ORDER — KETOROLAC TROMETHAMINE 30 MG/ML IJ SOLN
INTRAMUSCULAR | Status: DC | PRN
  Administered 2015-11-26: 30 mg via INTRAVENOUS

## 2015-11-26 MED ORDER — FENTANYL CITRATE (PF) 100 MCG/2ML IJ SOLN
INTRAMUSCULAR | Status: DC | PRN
  Administered 2015-11-26: 100 ug via INTRAVENOUS
  Administered 2015-11-26 (×4): 25 ug via INTRAVENOUS

## 2015-11-26 MED ORDER — FENTANYL CITRATE (PF) 100 MCG/2ML IJ SOLN
INTRAMUSCULAR | Status: AC
  Administered 2015-11-26: 50 ug via INTRAVENOUS
  Filled 2015-11-26: qty 2

## 2015-11-26 MED ORDER — ROCURONIUM BROMIDE 100 MG/10ML IV SOLN
INTRAVENOUS | Status: DC | PRN
  Administered 2015-11-26: 5 mg via INTRAVENOUS
  Administered 2015-11-26: 25 mg via INTRAVENOUS

## 2015-11-26 MED ORDER — ONDANSETRON HCL 4 MG/2ML IJ SOLN: 4.0000 mg | Freq: Once | INTRAMUSCULAR | Status: DC | PRN

## 2015-11-26 MED ORDER — ONDANSETRON HCL 4 MG/2ML IJ SOLN
INTRAMUSCULAR | Status: DC | PRN
  Administered 2015-11-26: 4 mg via INTRAVENOUS

## 2015-11-26 MED ORDER — BUPIVACAINE HCL 0.5 % IJ SOLN
INTRAMUSCULAR | Status: DC | PRN
  Administered 2015-11-26: 10 mL

## 2015-11-26 MED ORDER — HYDROMORPHONE HCL 1 MG/ML IJ SOLN
0.2500 mg | INTRAMUSCULAR | Status: DC | PRN
Start: 2015-11-26 — End: 2015-11-27
  Administered 2015-11-27 (×6): 0.25 mg via INTRAVENOUS

## 2015-11-26 MED ORDER — MORPHINE SULFATE (PF) 4 MG/ML IV SOLN
INTRAVENOUS | Status: AC
  Filled 2015-11-26: qty 1

## 2015-11-26 MED ORDER — PROPOFOL 10 MG/ML IV BOLUS
INTRAVENOUS | Status: DC | PRN
  Administered 2015-11-26: 160 mg via INTRAVENOUS

## 2015-11-26 MED ORDER — KETOROLAC TROMETHAMINE 30 MG/ML IJ SOLN
INTRAMUSCULAR | Status: AC
  Administered 2015-11-26: 30 mg via INTRAVENOUS
  Filled 2015-11-26: qty 1

## 2015-11-26 MED ORDER — LIDOCAINE HCL (CARDIAC) 20 MG/ML IV SOLN
INTRAVENOUS | Status: DC | PRN
  Administered 2015-11-26: 40 mg via INTRAVENOUS

## 2015-11-26 MED ORDER — SUCCINYLCHOLINE CHLORIDE 20 MG/ML IJ SOLN
INTRAMUSCULAR | Status: DC | PRN
  Administered 2015-11-26: 100 mg via INTRAVENOUS

## 2015-11-26 MED ORDER — DEXAMETHASONE SODIUM PHOSPHATE 10 MG/ML IJ SOLN
INTRAMUSCULAR | Status: DC | PRN
  Administered 2015-11-26: 5 mg via INTRAVENOUS

## 2015-11-26 MED ORDER — FENTANYL CITRATE (PF) 100 MCG/2ML IJ SOLN
50.0000 ug | Freq: Once | INTRAMUSCULAR | Status: AC
  Administered 2015-11-26: 50 ug via INTRAVENOUS

## 2015-11-26 SURGICAL SUPPLY — 55 items
ANCHOR TIS RET SYS 1550ML (BAG) ×3 IMPLANT
BAG URO DRAIN 2000ML W/SPOUT (MISCELLANEOUS) ×3 IMPLANT
BLADE SURG SZ11 CARB STEEL (BLADE) ×3 IMPLANT
CANISTER SUCT 1200ML W/VALVE (MISCELLANEOUS) ×3 IMPLANT
CATH FOLEY 2WAY  5CC 16FR (CATHETERS) ×1
CATH ROBINSON RED A/P 16FR (CATHETERS) IMPLANT
CATH URTH 16FR FL 2W BLN LF (CATHETERS) ×2 IMPLANT
CHLORAPREP W/TINT 26ML (MISCELLANEOUS) ×3 IMPLANT
COVER MAYO STAND STRL (DRAPES) ×3 IMPLANT
DRAPE LEGGINS SURG 28X43 STRL (DRAPES) ×6 IMPLANT
DRAPE UNDER BUTTOCK W/FLU (DRAPES) ×3 IMPLANT
DRESSING TELFA 4X3 1S ST N-ADH (GAUZE/BANDAGES/DRESSINGS) IMPLANT
DRSG TEGADERM 2-3/8X2-3/4 SM (GAUZE/BANDAGES/DRESSINGS) IMPLANT
ENDOPOUCH RETRIEVER 10 (MISCELLANEOUS) ×3 IMPLANT
GAUZE SPONGE NON-WVN 2X2 STRL (MISCELLANEOUS) IMPLANT
GELPOINT ADV PLATFORM (ENDOMECHANICALS) ×3
GLOVE BIOGEL PI IND STRL 6.5 (GLOVE) ×4 IMPLANT
GLOVE BIOGEL PI INDICATOR 6.5 (GLOVE) ×2
GLOVE SURG SYN 6.5 ES PF (GLOVE) ×6 IMPLANT
GOWN STRL REUS W/ TWL LRG LVL3 (GOWN DISPOSABLE) ×4 IMPLANT
GOWN STRL REUS W/ TWL XL LVL3 (GOWN DISPOSABLE) IMPLANT
GOWN STRL REUS W/TWL LRG LVL3 (GOWN DISPOSABLE) ×2
GOWN STRL REUS W/TWL XL LVL3 (GOWN DISPOSABLE)
GRASPER SUT TROCAR 14GX15 (MISCELLANEOUS) IMPLANT
IRRIGATION STRYKERFLOW (MISCELLANEOUS) IMPLANT
IRRIGATOR STRYKERFLOW (MISCELLANEOUS)
IV LACTATED RINGERS 1000ML (IV SOLUTION) ×3 IMPLANT
KIT RM TURNOVER CYSTO AR (KITS) ×3 IMPLANT
LABEL OR SOLS (LABEL) IMPLANT
LIGASURE BLUNT 5MM 37CM (INSTRUMENTS) ×6 IMPLANT
LIGASURE MARYLAND LAP STAND (ELECTROSURGICAL) IMPLANT
LIQUID BAND (GAUZE/BANDAGES/DRESSINGS) ×3 IMPLANT
MANIPULATOR UTERINE ZUMI 4.5 (MISCELLANEOUS) ×3 IMPLANT
NEEDLE HYPO 22GX1.5 SAFETY (NEEDLE) ×3 IMPLANT
NEEDLE VERESS 14GA 120MM (NEEDLE) ×3 IMPLANT
NS IRRIG 500ML POUR BTL (IV SOLUTION) ×3 IMPLANT
PACK LAP CHOLECYSTECTOMY (MISCELLANEOUS) ×3 IMPLANT
PAD OB MATERNITY 4.3X12.25 (PERSONAL CARE ITEMS) ×3 IMPLANT
PAD PREP 24X41 OB/GYN DISP (PERSONAL CARE ITEMS) ×3 IMPLANT
PAD TRENDELENBURG OR TABLE (MISCELLANEOUS) ×3 IMPLANT
PLATFORM STD W/COL CELL SVR (ENDOMECHANICALS) ×2 IMPLANT
SCISSORS METZENBAUM CVD 33 (INSTRUMENTS) ×3 IMPLANT
SHEARS HARMONIC ACE PLUS 36CM (ENDOMECHANICALS) IMPLANT
SLEEVE ENDOPATH XCEL 5M (ENDOMECHANICALS) ×3 IMPLANT
SPONGE VERSALON 2X2 STRL (MISCELLANEOUS)
SPONGE XRAY 4X4 16PLY STRL (MISCELLANEOUS) ×3 IMPLANT
SUT MNCRL AB 4-0 PS2 18 (SUTURE) ×3 IMPLANT
SUT VIC AB 2-0 UR6 27 (SUTURE) IMPLANT
SUT VIC AB 4-0 PS2 18 (SUTURE) IMPLANT
SUT VICRYL 0 AB UR-6 (SUTURE) ×3 IMPLANT
SYRINGE 10CC LL (SYRINGE) ×3 IMPLANT
TROCAR 5M 150ML BLDLS (TROCAR) ×3 IMPLANT
TROCAR ENDO BLADELESS 11MM (ENDOMECHANICALS) IMPLANT
TROCAR XCEL NON-BLD 5MMX100MML (ENDOMECHANICALS) ×3 IMPLANT
TUBING INSUFFLATOR HI FLOW (MISCELLANEOUS) ×3 IMPLANT

## 2015-11-26 NOTE — Anesthesia Preprocedure Evaluation (Addendum)
Anesthesia Evaluation  Patient identified by MRN, date of birth, ID band Patient awake    Reviewed: Allergy & Precautions, H&P , NPO status , Patient's Chart, lab work & pertinent test results, reviewed documented beta blocker date and time   Airway Mallampati: II  TM Distance: >3 FB Neck ROM: full    Dental  (+) Teeth Intact   Pulmonary neg pulmonary ROS, Current Smoker,    Pulmonary exam normal        Cardiovascular negative cardio ROS Normal cardiovascular exam Rhythm:regular Rate:Normal     Neuro/Psych negative neurological ROS  negative psych ROS   GI/Hepatic negative GI ROS, Neg liver ROS,   Endo/Other  negative endocrine ROS  Renal/GU negative Renal ROS  negative genitourinary   Musculoskeletal   Abdominal   Peds  Hematology negative hematology ROS (+)   Anesthesia Other Findings   Reproductive/Obstetrics negative OB ROS                             Anesthesia Physical Anesthesia Plan  ASA: II and emergent  Anesthesia Plan: General ETT   Post-op Pain Management:    Induction:   Airway Management Planned:   Additional Equipment:   Intra-op Plan:   Post-operative Plan:   Informed Consent: I have reviewed the patients History and Physical, chart, labs and discussed the procedure including the risks, benefits and alternatives for the proposed anesthesia with the patient or authorized representative who has indicated his/her understanding and acceptance.     Plan Discussed with: CRNA  Anesthesia Plan Comments:         Anesthesia Quick Evaluation

## 2015-11-26 NOTE — H&P (Addendum)
Consult History and Physical   SERVICE: Gynecology   Patient Name: Brittany Torres Patient MRN:   [id number]  CC: bilateral pelvic masses, exquisite and acute left sided pain   HPI: Brittany Torres is a 35 y.o. [id number] who presents to the ED with sudden onset LLQ pain which woke her from sleep this morning. She has a history of a similar episode in 06/2015, for which she was evaluated, but was sent home and did not follow up.  Today ultrasound shows bilateral 9cm ovarian cysts suspicious for teratomas, but flow could not be demonstrated - neither by [place] tech nor by a repeat US by me at the bedside.  She continues to have pain in spite of generous opioid administration.  She is able to have a conversation, however.    She denies PID or other pelvic infection, no fever or chills, no dizziness expect during waves of pain.  No dysuria or urinary complaints, no bowel complaints.  No history of pelvic surgeries, 2 vaginal deliveries.  History of cholecystectomy.    Patient is not sexually active, and denies possible pregnancy.  She has been NPO since this morning. She has two young children at home, currently in the care of a babysitter.  She is able to leave them overnight with her.   Review of Systems: positives in bold GEN:   fevers, chills, weight changes, appetite changes, fatigue, night sweats HEENT:  HA, vision changes, hearing loss, congestion, rhinorrhea, sinus pressure, dysphagia CV:   CP, palpitations PULM:  SOB, cough GI:  abd pain, N/V/D/C GU:  dysuria, urgency, frequency MSK:  arthralgias, myalgias, back pain, swelling SKIN:  rashes, color changes, pallor NEURO:  numbness, weakness, tingling, seizures, dizziness, tremors PSYCH:  depression, anxiety, behavioral problems, confusion  HEME/LYMPH:  easy bruising or bleeding ENDO:  heat/cold intolerance  Past Obstetrical History: OB History    No data available      Past Gynecologic History: No LMP recorded.  Menstrual frequency  Denies abnormal pap, last pap:   Past Medical History: Past Medical History  Diagnosis Date  . Ovarian cyst   Obesity  Past Surgical History:   Past Surgical History  Procedure Laterality Date  . Cholecystectomy    Laparoscopic  Family History:  family history is not on file.  Social History:  Social History   Social History  . Marital Status: Single    Spouse Name: N/A  . Number of Children: N/A  . Years of Education: N/A   Occupational History  . Not on file.   Social History Main Topics  . Smoking status: Never Smoker   . Smokeless tobacco: Not on file  . Alcohol Use: No  . Drug Use: Not on file  . Sexual Activity: Not on file   Other Topics Concern  . Not on file   Social History Narrative    Home Medications:  Medications reconciled in EPIC  No current facility-administered medications on file prior to encounter.   Current Outpatient Prescriptions on File Prior to Encounter  Medication Sig Dispense Refill  . naproxen (NAPROSYN) 500 MG tablet Take 1 tablet (500 mg total) by mouth 2 (two) times daily with a meal. 20 tablet 0  . promethazine (PHENERGAN) 25 MG tablet Take 1 tablet (25 mg total) by mouth every 6 (six) hours as needed for nausea or vomiting. 15 tablet 0    Allergies:  No Known Allergies  Physical Exam:  Temp:  [98 F (36.7 C)] 98 F (36.7 C) (01/15  1144) Pulse Rate:  [63] 63 (01/15 1144) Resp:  [20] 20 (01/15 1144) BP: (143)/(92) 143/92 mmHg (01/15 1144) SpO2:  [100 %] 100 % (01/15 1144) Weight:  [92.534 kg (204 lb)] 92.534 kg (204 lb) (01/15 1144)   General Appearance:  Well developed, well nourished, no acute distress, alert and oriented x3 HEENT:  Normocephalic atraumatic, extraocular movements intact, moist mucous membranes Cardiovascular:  Normal S1/S2, regular rate and rhythm, no murmurs Pulmonary:  clear to auscultation, no wheezes, rales or rhonchi, symmetric air entry, good air exchange Abdomen:   Bowel sounds present, soft, tender in LLQ, nondistended, no abnormal masses, no epigastric pain, + guarding with palpation in LLQ. Extremities:  Full range of motion, no pedal edema, 2+ distal pulses, no tenderness Skin:  normal coloration and turgor, no rashes, no suspicious skin lesions noted  Neurologic:  Cranial nerves 2-12 grossly intact, normal muscle tone, strength 5/5 all four extremities Psychiatric:  Normal mood and affect, appropriate, no AH/VH Pelvic:  deferred for OR   Labs/Studies:   CBC and Coags:  Lab Results  Component Value Date   WBC 10.4 11/26/2015   NEUTOPHILPCT 58 07/06/2015   EOSPCT 2 07/06/2015   BASOPCT 1 07/06/2015   LYMPHOPCT 33 07/06/2015   HGB 14.3 11/26/2015   HCT 42.5 11/26/2015   MCV 85.3 11/26/2015   PLT 336 11/26/2015   CMP:  Lab Results  Component Value Date   NA 137 11/26/2015   K 3.5 11/26/2015   CL 104 11/26/2015   CO2 22 11/26/2015   BUN 9 11/26/2015   CREATININE 0.42* 11/26/2015   CREATININE 0.56 07/06/2015   PROT 8.5* 11/26/2015   BILITOT 0.5 11/26/2015   ALT 25 11/26/2015   AST 24 11/26/2015   ALKPHOS 72 11/26/2015  BHCG <1  Other Imaging: US Transvaginal Non-ob  11/26/2015  CLINICAL DATA:  LEFT lower quadrant pain since 3 a.m. Intermittent pain. Known bilateral dermoid tumors. EXAM: TRANSABDOMINAL AND TRANSVAGINAL ULTRASOUND OF PELVIS TECHNIQUE: Both transabdominal and transvaginal ultrasound examinations of the pelvis were performed. Transabdominal technique was performed for global imaging of the pelvis including uterus, ovaries, adnexal regions, and pelvic cul-de-sac. COMPARISON:  CT 07/06/2015 FINDINGS: Uterus Measurements: Normal in size and echogenicity measuring 9.0 x 3.9 x 4.7 cm. No fibroids or other mass visualized. Endometrium Thickness: Normal thickness for premenopausal female at 5.5 mm. No focal abnormality visualized. Right ovary Measurements: Not identified. RIGHT adnexal mass which is heterogeneous in echotexture  measuring 8.5 x 8.1 x 1.8 cm. Left ovary Measurements: Not identified. LEFT heterogeneous LEFT adnexal mass measures 7.4 x 6.6 x 7.3 cm. Other findings No abnormal free fluid. IMPRESSION: 1. Bilateral complex heterogeneous adnexal masses correspond to dermoid lesions seen on comparison CT of 07/06/2015. The RIGHT dermoid lesion measures larger than comparison CT. Recommend non emergent GYN surgical consultation for potential resection. 2. Normal uterus. 3. No free fluid. Electronically Signed   By: Suzy Bouchard M.D.   On: 11/26/2015 17:24   US Renal  11/26/2015  CLINICAL DATA:  Intermittent lower abdominal/flank pain EXAM: RENAL / URINARY TRACT ULTRASOUND COMPLETE COMPARISON:  None. FINDINGS: Right Kidney: Length: 11.8 cm. Echogenicity and renal cortical thickness are within normal limits. No mass, perinephric fluid, or hydronephrosis visualized. No sonographically demonstrable calculus or ureterectasis. Left Kidney: Length: 11.9 cm. Echogenicity and renal cortical thickness are within normal limits. No mass, perinephric fluid, or hydronephrosis visualized. No sonographically demonstrable calculus or ureterectasis. Bladder: Appears normal for degree of bladder distention. Note the urinary bladder is largely contracted  with visualization of the urinary bladder limited on this study. IMPRESSION: Study within normal limits. Electronically Signed   By: Lowella Grip III M.D.   On: 11/26/2015 17:14     Assessment / Plan:   Chantrice PINEDO GILES is a 35 y.o. G2P2. who presents with LLQ pain, bilateral adnexal masses, cannot rule out torsion.  1. While the size and bilaterality of these tumors are unlikely for torsion, the patient's presentation, continued intermittent pain, and lack of demonstrable free fluid or blood flow to the left ovary leaves me with a requisite evaluation for ovarian torsion.  This is emergent surgery.  I plan to do a laparoscopic approach initially, and attempt to salvage one if  not both ovaries without rupture of the cysts.  I explained to the patient that she may lose both ovaries, and she understands this would result in permanent sterility and immediate menopause.  She is willing to proceed.  I have notified the OR of the urgency of this case, and they are preparing the room.  Consents signed and witnessed.    Thank you for the opportunity to be involved with this patient's care.   ----- Larey Days, MD Attending Obstetrician and Gynecologist [facility]

## 2015-11-26 NOTE — Transfer of Care (Signed)
Immediate Anesthesia Transfer of Care Note  Patient: Brittany Torres  Procedure(s) Performed: Procedure(s): DIAGNOSTIC LAPAROSCOPY  (Bilateral) LAPAROSCOPIC SALPINGO OOPHORECTOMY (Bilateral) HERNIA REPAIR UMBILICAL ADULT  Patient Location: PACU  Anesthesia Type:General  Level of Consciousness: awake, alert  and oriented  Airway & Oxygen Therapy: Patient Spontanous Breathing and Patient connected to face mask oxygen  Post-op Assessment: Report given to RN and Post -op Vital signs reviewed and stable  Post vital signs: Reviewed and stable  Last Vitals:  Filed Vitals:   11/26/15 2348 11/26/15 2350  BP: 148/78   Pulse: 108   Temp: 36.4 C 36.4 C  Resp: 21     Complications: No apparent anesthesia complications

## 2015-11-26 NOTE — Anesthesia Procedure Notes (Signed)
Procedure Name: Intubation Date/Time: 11/26/2015 8:33 PM Performed by: Kennon Holter Pre-anesthesia Checklist: Timeout performed, Patient being monitored, Suction available, Emergency Drugs available and Patient identified Patient Re-evaluated:Patient Re-evaluated prior to inductionOxygen Delivery Method: Circle system utilized Ventilation: Mask ventilation without difficulty Laryngoscope Size: Miller and 2 Grade View: Grade I Tube type: Oral Tube size: 7.0 mm Number of attempts: 1 Airway Equipment and Method: Stylet Placement Confirmation: ETT inserted through vocal cords under direct vision,  positive ETCO2 and breath sounds checked- equal and bilateral Secured at: 21 cm Dental Injury: Teeth and Oropharynx as per pre-operative assessment

## 2015-11-26 NOTE — ED Notes (Signed)
With MD at bedside for pelvic exam. Pt tolerated well.

## 2015-11-26 NOTE — ED Provider Notes (Signed)
[facility] Emergency Department Provider Note  ____________________________________________  Time seen: Approximately 3:44 PM  I have reviewed the triage vital signs and the nursing notes.   HISTORY  Chief Complaint Abdominal Pain    HPI Brittany Torres is a 35 y.o. female reports a previous history of cysts on her ovaries.  Patient reports that this morning she started having fairly severe left lower quadrant pain. This is a sharp pain located in the lower pelvis. Reports it feels the same as when she had a "cyst" on the left ovary in August. But associated with any fevers chills chest pain or trouble breathing. No diarrhea. Denies vaginal discharge or bleeding.  States the pain was severe and 10 out of 10 located in the left lower pelvis.   Past Medical History  Diagnosis Date  . Ovarian cyst     There are no active problems to display for this patient.   Past Surgical History  Procedure Laterality Date  . Cholecystectomy      Current Outpatient Rx  Name  Route  Sig  Dispense  Refill  . naproxen (NAPROSYN) 500 MG tablet   Oral   Take 1 tablet (500 mg total) by mouth 2 (two) times daily with a meal.   20 tablet   0   . promethazine (PHENERGAN) 25 MG tablet   Oral   Take 1 tablet (25 mg total) by mouth every 6 (six) hours as needed for nausea or vomiting.   15 tablet   0     Allergies Review of patient's allergies indicates no known allergies.  No family history on file.  Social History Social History  Substance Use Topics  . Smoking status: Never Smoker   . Smokeless tobacco: Not on file  . Alcohol Use: No    Review of Systems Constitutional: No fever/chills Eyes: No visual changes. ENT: No sore throat. Cardiovascular: Denies chest pain. Respiratory: Denies shortness of breath. Gastrointestinal: No nausea or vomiting. No diarrhea.  No constipation. Genitourinary: Negative for dysuria. Musculoskeletal: Negative  for back pain. Skin: Negative for rash. Neurological: Negative for headaches, focal weakness or numbness.  10-point ROS otherwise negative.  ____________________________________________   PHYSICAL EXAM:  VITAL SIGNS: ED Triage Vitals  Enc Vitals Group     BP 11/26/15 1144 143/92 mmHg     Pulse Rate 11/26/15 1144 63     Resp 11/26/15 1144 20     Temp 11/26/15 1144 98 F (36.7 C)     Temp Source 11/26/15 1144 Oral     SpO2 11/26/15 1144 100 %     Weight 11/26/15 1144 204 lb (92.534 kg)     Height 11/26/15 1144 5\' 4"  (1.626 m)     Head Cir --      Peak Flow --      Pain Score 11/26/15 1149 10     Pain Loc --      Pain Edu? --      Excl. in [place]? --    Constitutional: Alert and oriented. Patient standing up, uncomfortable appears that she is not able to find a position of comfort Eyes: Conjunctivae are normal. PERRL. EOMI. Head: Atraumatic. Nose: No congestion/rhinnorhea. Mouth/Throat: Mucous membranes are moist.  Oropharynx non-erythematous. Neck: No stridor.   Cardiovascular: Normal rate, regular rhythm. Grossly normal heart sounds.  Good peripheral circulation. Respiratory: Normal respiratory effort.  No retractions. Lungs CTAB. Gastrointestinal: Soft and nontender except for moderate discomfort to palpation in the lower left pelvis. No distention.  No abdominal bruits. No CVA tenderness. No pain at McBurney's point. Negative Rovsing. Genitourinary exam performed with nurse Ebony Hail: Normal external, internal exam demonstrates mild white discharge without tenderness except for along the left adnexa. No cervical motion tenderness. No unusual odor. Musculoskeletal: No lower extremity tenderness nor edema.  No joint effusions. Neurologic:  Normal speech and language. No gross focal neurologic deficits are appreciated. Skin:  Skin is warm, dry and intact. No rash noted. Psychiatric: Mood and affect are normal. Speech and behavior are  normal.  ____________________________________________   LABS (all labs ordered are listed, but only abnormal results are displayed)  Labs Reviewed  WET PREP, GENITAL - Abnormal; Notable for the following:    WBC, Wet Prep HPF POC MODERATE (*)    All other components within normal limits  COMPREHENSIVE METABOLIC PANEL - Abnormal; Notable for the following:    Glucose, Bld 106 (*)    Creatinine, Ser 0.42 (*)    Total Protein 8.5 (*)    All other components within normal limits  URINALYSIS COMPLETEWITH MICROSCOPIC ([facility] ONLY) - Abnormal; Notable for the following:    Color, Urine YELLOW (*)    APPearance HAZY (*)    Ketones, ur 1+ (*)    Hgb urine dipstick 2+ (*)    Leukocytes, UA TRACE (*)    Squamous Epithelial / LPF 0-5 (*)    All other components within normal limits  CHLAMYDIA/NGC RT PCR ([facility] ONLY)  LIPASE, BLOOD  CBC  HCG, QUANTITATIVE, PREGNANCY   ____________________________________________  EKG   ____________________________________________  RADIOLOGY   US Pelvis Complete (Final result) Result time: 11/26/15 17:24:00   Final result by Rad Results In Interface (11/26/15 17:24:00)   Narrative:   CLINICAL DATA: LEFT lower quadrant pain since 3 a.m. Intermittent pain. Known bilateral dermoid tumors.  EXAM: TRANSABDOMINAL AND TRANSVAGINAL ULTRASOUND OF PELVIS  TECHNIQUE: Both transabdominal and transvaginal ultrasound examinations of the pelvis were performed. Transabdominal technique was performed for global imaging of the pelvis including uterus, ovaries, adnexal regions, and pelvic cul-de-sac.  COMPARISON: CT 07/06/2015  FINDINGS: Uterus  Measurements: Normal in size and echogenicity measuring 9.0 x 3.9 x 4.7 cm. No fibroids or other mass visualized.  Endometrium  Thickness: Normal thickness for premenopausal female at 5.5 mm. No focal abnormality visualized.  Right ovary  Measurements: Not identified.  RIGHT adnexal mass  which is heterogeneous in echotexture measuring 8.5 x 8.1 x 1.8 cm.  Left ovary  Measurements: Not identified.  LEFT heterogeneous LEFT adnexal mass measures 7.4 x 6.6 x 7.3 cm.  Other findings  No abnormal free fluid.  IMPRESSION: 1. Bilateral complex heterogeneous adnexal masses correspond to dermoid lesions seen on comparison CT of 07/06/2015. The RIGHT dermoid lesion measures larger than comparison CT. Recommend non emergent GYN surgical consultation for potential resection. 2. Normal uterus. 3. No free fluid.   Electronically Signed By: Suzy Bouchard M.D. On: 11/26/2015 17:24          US Transvaginal Non-OB (Final result) Result time: 11/26/15 17:24:00   Final result by Rad Results In Interface (11/26/15 17:24:00)   Narrative:   CLINICAL DATA: LEFT lower quadrant pain since 3 a.m. Intermittent pain. Known bilateral dermoid tumors.  EXAM: TRANSABDOMINAL AND TRANSVAGINAL ULTRASOUND OF PELVIS  TECHNIQUE: Both transabdominal and transvaginal ultrasound examinations of the pelvis were performed. Transabdominal technique was performed for global imaging of the pelvis including uterus, ovaries, adnexal regions, and pelvic cul-de-sac.  COMPARISON: CT 07/06/2015  FINDINGS: Uterus  Measurements: Normal in size and echogenicity measuring  9.0 x 3.9 x 4.7 cm. No fibroids or other mass visualized.  Endometrium  Thickness: Normal thickness for premenopausal female at 5.5 mm. No focal abnormality visualized.  Right ovary  Measurements: Not identified.  RIGHT adnexal mass which is heterogeneous in echotexture measuring 8.5 x 8.1 x 1.8 cm.  Left ovary  Measurements: Not identified.  LEFT heterogeneous LEFT adnexal mass measures 7.4 x 6.6 x 7.3 cm.  Other findings  No abnormal free fluid.  IMPRESSION: 1. Bilateral complex heterogeneous adnexal masses correspond to dermoid lesions seen on comparison CT of 07/06/2015. The RIGHT dermoid lesion  measures larger than comparison CT. Recommend non emergent GYN surgical consultation for potential resection. 2. Normal uterus. 3. No free fluid.   Electronically Signed By: Suzy Bouchard M.D. On: 11/26/2015 17:24          US Renal (Final result) Result time: 11/26/15 17:14:17   Final result by Rad Results In Interface (11/26/15 17:14:17)   Narrative:   CLINICAL DATA: Intermittent lower abdominal/flank pain  EXAM: RENAL / URINARY TRACT ULTRASOUND COMPLETE  COMPARISON: None.  FINDINGS: Right Kidney:  Length: 11.8 cm. Echogenicity and renal cortical thickness are within normal limits. No mass, perinephric fluid, or hydronephrosis visualized. No sonographically demonstrable calculus or ureterectasis.  Left Kidney:  Length: 11.9 cm. Echogenicity and renal cortical thickness are within normal limits. No mass, perinephric fluid, or hydronephrosis visualized. No sonographically demonstrable calculus or ureterectasis.  Bladder:  Appears normal for degree of bladder distention. Note the urinary bladder is largely contracted with visualization of the urinary bladder limited on this study.  IMPRESSION: Study within normal limits.   Electronically Signed By: Lowella Grip III M.D. On: 11/26/2015 17:14    ____________________________________________   PROCEDURES  Procedure(s) performed: None  Critical Care performed: No  ____________________________________________   INITIAL IMPRESSION / ASSESSMENT AND PLAN / ED COURSE  Pertinent labs & imaging results that were available during my care of the patient were reviewed by me and considered in my medical decision making (see chart for details).  Patient culture evaluation of rather abrupt onset severe left lower abdominal pain described as pelvic pain with a history of known ovarian cysts. Patient is not pregnant. Primary concern rule out ovarian torsion, hemorrhagic cyst, or other acute  gynecologic abnormality. We'll start with ultrasound. In addition the patient does report some mild discomfort in the left lower back and given the severe left lower pelvic pain alternative consideration would certainly include kidney stone. No evidence of pyelonephritis, leukocytosis, and no fever.  The remainder of her abdominal exam is quite reassuring with no evidence to suggest acute appendicitis. Previous history of cholecystectomy. Abdominal labs quite reassuring. Labs to indicate possible mild bacterial vaginosis, no signs or symptoms suggest acute PID based on history or physical.  ----------------------------------------- 3:49 PM on 11/26/2015 -----------------------------------------   Patient reports her pain is much better. She is now resting comfortably and does continue to have moderate left lower quadrant tenderness to exam.  ----------------------------------------- 7:54 PM on 11/26/2015 -----------------------------------------  Patient being admitted by obstetrics and gynecology, Dr. Leonides Schanz. ____________________________________________   FINAL CLINICAL IMPRESSION(S) / ED DIAGNOSES  Final diagnoses:  Bilateral ovarian cysts      Delman Kitten, MD 11/26/15 1954

## 2015-11-26 NOTE — ED Notes (Signed)
Patient is complaining of left lower quadrant abdominal pain.  Patient is tachypneic and diaphoretic in triage.  Patient appears extremely uncomfortable.  Through spanish interpreter:  "I have a lot of pain in my belly, I have had this pain before and they tell me it's because of ovarian cysts."  Patient reports just finishing her lmp.  Patient reports pain in her back as well as her abdomen.  Patient's abdomen is tender on palpation.  Patient denies vomiting and diarrhea.

## 2015-11-27 ENCOUNTER — Encounter: Payer: Self-pay | Admitting: Obstetrics & Gynecology

## 2015-11-27 DIAGNOSIS — N83202 Unspecified ovarian cyst, left side: Secondary | ICD-10-CM

## 2015-11-27 DIAGNOSIS — N83519 Torsion of ovary and ovarian pedicle, unspecified side: Secondary | ICD-10-CM | POA: Diagnosis present

## 2015-11-27 DIAGNOSIS — R1032 Left lower quadrant pain: Secondary | ICD-10-CM | POA: Diagnosis not present

## 2015-11-27 DIAGNOSIS — N83201 Unspecified ovarian cyst, right side: Secondary | ICD-10-CM | POA: Diagnosis present

## 2015-11-27 DIAGNOSIS — D270 Benign neoplasm of right ovary: Secondary | ICD-10-CM | POA: Diagnosis not present

## 2015-11-27 DIAGNOSIS — K429 Umbilical hernia without obstruction or gangrene: Secondary | ICD-10-CM | POA: Diagnosis not present

## 2015-11-27 DIAGNOSIS — Z6835 Body mass index (BMI) 35.0-35.9, adult: Secondary | ICD-10-CM | POA: Diagnosis not present

## 2015-11-27 DIAGNOSIS — D271 Benign neoplasm of left ovary: Secondary | ICD-10-CM | POA: Diagnosis not present

## 2015-11-27 DIAGNOSIS — R102 Pelvic and perineal pain: Secondary | ICD-10-CM | POA: Diagnosis not present

## 2015-11-27 DIAGNOSIS — D279 Benign neoplasm of unspecified ovary: Secondary | ICD-10-CM | POA: Diagnosis present

## 2015-11-27 DIAGNOSIS — E669 Obesity, unspecified: Secondary | ICD-10-CM | POA: Diagnosis not present

## 2015-11-27 DIAGNOSIS — Z79899 Other long term (current) drug therapy: Secondary | ICD-10-CM | POA: Diagnosis not present

## 2015-11-27 DIAGNOSIS — Z9049 Acquired absence of other specified parts of digestive tract: Secondary | ICD-10-CM | POA: Diagnosis not present

## 2015-11-27 LAB — CBC
HCT: 39.2 % (ref 35.0–47.0)
Hemoglobin: 13.3 g/dL (ref 12.0–16.0)
MCH: 29.4 pg (ref 26.0–34.0)
MCHC: 33.8 g/dL (ref 32.0–36.0)
MCV: 87.1 fL (ref 80.0–100.0)
PLATELETS: 290 10*3/uL (ref 150–440)
RBC: 4.5 MIL/uL (ref 3.80–5.20)
RDW: 12.7 % (ref 11.5–14.5)
WBC: 14.6 10*3/uL — AB (ref 3.6–11.0)

## 2015-11-27 LAB — ABO/RH: ABO/RH(D): O POS

## 2015-11-27 MED ORDER — IBUPROFEN 600 MG PO TABS: 600.0000 mg | ORAL_TABLET | Freq: Four times a day (QID) | ORAL | Status: DC

## 2015-11-27 MED ORDER — HYDROMORPHONE HCL 1 MG/ML IJ SOLN
INTRAMUSCULAR | Status: AC
  Filled 2015-11-27: qty 1

## 2015-11-27 MED ORDER — ACETAMINOPHEN 325 MG PO TABS
650.0000 mg | ORAL_TABLET | ORAL | Status: DC
  Administered 2015-11-27 (×2): 650 mg via ORAL
  Filled 2015-11-27 (×2): qty 2

## 2015-11-27 MED ORDER — HYDROMORPHONE HCL 1 MG/ML IJ SOLN
0.5000 mg | INTRAMUSCULAR | Status: DC | PRN
  Administered 2015-11-27: 0.5 mg via INTRAVENOUS
  Filled 2015-11-27: qty 1

## 2015-11-27 MED ORDER — TRAMADOL HCL 50 MG PO TABS
50.0000 mg | ORAL_TABLET | Freq: Four times a day (QID) | ORAL | Status: DC | PRN
  Administered 2015-11-27: 50 mg via ORAL
  Filled 2015-11-27: qty 1

## 2015-11-27 MED ORDER — MENTHOL 3 MG MT LOZG: 1.0000 | LOZENGE | OROMUCOSAL | Status: DC | PRN

## 2015-11-27 MED ORDER — ONDANSETRON HCL 4 MG PO TABS: 4.0000 mg | ORAL_TABLET | Freq: Four times a day (QID) | ORAL | Status: DC | PRN

## 2015-11-27 MED ORDER — TRAMADOL HCL 50 MG PO TABS: 50.0000 mg | ORAL_TABLET | Freq: Four times a day (QID) | ORAL | Status: DC | PRN

## 2015-11-27 MED ORDER — ONDANSETRON HCL 4 MG/2ML IJ SOLN
4.0000 mg | Freq: Four times a day (QID) | INTRAMUSCULAR | Status: DC | PRN
Start: 2015-11-27 — End: 2015-11-27

## 2015-11-27 MED ORDER — ESTRADIOL 0.5 MG PO TABS: 0.5000 mg | ORAL_TABLET | Freq: Every day | ORAL | Status: DC

## 2015-11-27 MED ORDER — MEDROXYPROGESTERONE ACETATE 2.5 MG PO TABS
2.5000 mg | ORAL_TABLET | Freq: Every day | ORAL | Status: DC
  Administered 2015-11-27: 2.5 mg via ORAL
  Filled 2015-11-27 (×2): qty 1

## 2015-11-27 MED ORDER — MEDROXYPROGESTERONE ACETATE 2.5 MG PO TABS: 2.5000 mg | ORAL_TABLET | Freq: Every day | ORAL | Status: DC

## 2015-11-27 MED ORDER — ESTRADIOL 1 MG PO TABS
0.5000 mg | ORAL_TABLET | Freq: Every day | ORAL | Status: DC
  Administered 2015-11-27: 0.5 mg via ORAL
  Filled 2015-11-27: qty 0.5

## 2015-11-27 MED ORDER — ACETAMINOPHEN 325 MG PO TABS: 650.0000 mg | ORAL_TABLET | ORAL | Status: AC

## 2015-11-27 MED ORDER — ESTRADIOL 1 MG PO TABS
0.5000 mg | ORAL_TABLET | Freq: Every day | ORAL | Status: DC
  Filled 2015-11-27 (×2): qty 0.5

## 2015-11-27 MED ORDER — SIMETHICONE 80 MG PO CHEW: 160.0000 mg | CHEWABLE_TABLET | Freq: Four times a day (QID) | ORAL | Status: DC | PRN

## 2015-11-27 MED ORDER — SODIUM CHLORIDE 0.9 % IV SOLN
50.0000 mL/h | INTRAVENOUS | Status: DC
  Administered 2015-11-27: 50 mL/h via INTRAVENOUS

## 2015-11-27 MED ORDER — DOCUSATE SODIUM 100 MG PO CAPS
100.0000 mg | ORAL_CAPSULE | Freq: Two times a day (BID) | ORAL | Status: DC
Start: 2015-11-27 — End: 2015-11-27

## 2015-11-27 NOTE — Progress Notes (Signed)
Discharge instructions reviewed with pt/husband.  Verb u/o and Rx given for home use.  Verbalizes u/o of meds for home use.

## 2015-11-27 NOTE — Discharge Instructions (Signed)
Discharge instructions:  Call office if you have any of the following: fever >101 F, chills, excessive vaginal bleeding, incision drainage or problems, leg pain or redness, or any other concerns.   No driving while taking pain medications.  You will be in menopause.  You are receiving prescriptions to add-back the hormones to prevent severe symptoms, bone loss, and to support cardiovascular health.  It is important you remain on these medications. You have been prescribed the lowest-cost generic medications.  If you are unable to afford these, please contact the charitable foundation at [facility] for assistance.

## 2015-11-27 NOTE — Anesthesia Postprocedure Evaluation (Signed)
Anesthesia Post Note  Patient: [facility]  Procedure(s) Performed: Procedure(s) (LRB): DIAGNOSTIC LAPAROSCOPY  (Bilateral) LAPAROSCOPIC SALPINGO OOPHORECTOMY (Bilateral) HERNIA REPAIR UMBILICAL ADULT  Patient location during evaluation: PACU Anesthesia Type: General Level of consciousness: awake and alert Pain management: pain level controlled Vital Signs Assessment: post-procedure vital signs reviewed and stable Respiratory status: spontaneous breathing, nonlabored ventilation, respiratory function stable and patient connected to nasal cannula oxygen Cardiovascular status: blood pressure returned to baseline and stable Postop Assessment: no signs of nausea or vomiting Anesthetic complications: no    Last Vitals:  Filed Vitals:   11/26/15 2348 11/27/15 0003  BP: 148/78 140/86  Pulse: 108 103  Temp: 36.4 C   Resp: 21 24    Last Pain:  Filed Vitals:   11/27/15 0006  PainSc: [place]

## 2015-11-27 NOTE — Progress Notes (Signed)
Discharged to home.  To car via w/c

## 2015-11-27 NOTE — Op Note (Signed)
Total Laparoscopic Hysterectomy Operative Note Procedure Date: 11/26/2015  Patient:  Brittany Torres  35 y.o. 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 Operative findings: Small uterus, normal fallopian tubes bilaterally.  14cm left ovary flipped once around its pedicle, 9cm right ovary with outpouches of fat and cystic fluid.  Normal liver, stomach, and diaphragm. Bilateral ureters were seen vermiculating pre-and post-procedure and away from surgical site. Omentum adherent to right upper abdominal wall.  Upon removal of left ovary, fatty fluid, tissue, and hair were noted.  On right ovary, no hair was noted.  1cm umbilical hernia was confirmed noted during surgery (previously noted on CT in 8/16).  SPECIMEN: bilateral fallopian tubes and ovaries  COMPLICATIONS: none apparent  DISPOSITION: vital signs stable to PACU  Indication for Surgery: 35 y.o. G2P2 who presented to the ED with acute onset LLQ pain, not completely resolved with narcotics, and found to have bilateral ovarian masses suggestive of dermoid cysts, increased in size since previous evaluation 4 months prior. No doppler flow was noted on left or right; torsion could not be ruled out.  Risks of surgery were discussed with the patient including but not limited to: bleeding which may require transfusion or reoperation; infection which may require antibiotics; injury to bowel, bladder, ureters or other surrounding organs; need for additional  procedures including laparotomy, blood clot, incisional problems and other postoperative/anesthesia complications.  Permanent sterilization and menopause were also discussed. Written informed consent was obtained.     PROCEDURE IN DETAIL:  The patient had sequential compression devices applied to her lower extremities.  General anesthesia was administered via endotracheal route.  She was placed in the dorsal lithotomy position, and was prepped and draped in a sterile manner. A surgical time-out was performed.  A Foley catheter was inserted into her bladder and attached to constant drainage and Zumi uterine manipulator was then advanced into the uterus. The gloves were changed, and attention was turned to the abdomen.  Due to the location of the ovary, a palmer's point incision was preferred.  An incision was made into the skin and a Veress needle was inserted and a drop test confirmed appropriate position.  Opening pressure was 4 but then immediately jumped to 15.  The veress was removed and attempted placement with same actions and opening pressure of 6, and again the jump to 66mmHg.  Attempts were then performed using the visiport method, and no clear visualization was achieved.  This site was then abandoned, and open entry at the umbilicus was performed.  The skin was divided with the knife.  The subcutaneous tissues were divided to the fascia.  The umbilical defect was noted, and the edges of the fascia were grasped with two kocher clamps.  The fascia was divided longitudinally and the peritoneum entered.  About a 3cm incision was made in the fascia and the GelPoint single site incision apparatus was inserted and secured.  After appropriate insufflation, The camera was inserted and findings noted as above.  Two 67mm ports were inserted in the lower left and right quadrants  under visualization.    The left ovary was resting upon the bowels and once in trendelenburg was able to be further evaluated.  There was  a single flip of the ovary upon itself.  There were no signs of blood supply compromise at the time of surgery.  The left tube was normal.  The sigmoid colon had physiologic adhesions to the left pelvic sidewall.  This was drawn medially and the ureter was visualized and nowhere near the IP which had been pulled out of its normal anatomic position due to the mass of the ovary.  The utero-ovarian ligament was sealed and divided, and the mesosalpinx was further divided distally.  The IP was doubly cauterized and divided, and hemostatic.  This freed the ovary from its pedicle.  The attention was turned to the right ovary.  It was enlarged, and clearly abnormal with pockets of fatty fluid, clear fluid, and remaining mass within.  I hesitated to remove this ovary due to menopause in a young woman, but there was no normal ovarian tissue to be salvaged.  The tissue most adjacent to the IP, utero-ovarian ligament, and mesosalpinx was both cystic and fat-filled.  Attempts to find normal tissue failed.  The size of the ovary and the clear presence of mesodermal material, and absence of normal appearing ovarian tissue were factors swaying the decision to remove the right ovary.  The ureter was noted far below the IP, thus the IP was grasped with the Ligasure and doubly cauterized and divided.  The pedicle was hemostatic.  The mesosalpinx was divided to the utero-ovarian ligament which was also double cauterized and divided.   The left ovary was placed into a spleen-sized endocatch bag and drawn to the surface.  The GelPoint apparatus was removed and the open end of the bag was drawn up.  The ovary was then pierced and the fatty fluid was withdrawn.  This collapsed the ovary and scissors were used to divide it so it would pass through the umbilical opening.  No fat or fluid was spilled onto the skin or into the peritoneal cavity.  Copious fat and hair were noted.  The GelPoint apparatus was reassembled and pneumoperitoneum  re-created.  The surgical site was observed and hemostatic.  The right ovary was placed into an endocatch bag and the same process was performed.  There was no hair noted in the right ovary, however copious fatty fluid.  Again there was no spillage of fluid into the peritoneum.   The trochars and retractor was removed, and the pneumoperitoneum desufflated.  The fascia of the umbilical incision was closed with 0 vicryl in a running stitch and tested for defects.   All skin incisions were closed with 4-0 monocryl and covered with surgical glue. The foley and the uterine manipulator were removed.  There was some bleeding from the tenaculum site, and silver nitrate was used to create hemostasis. The patient tolerated the procedure well.  All instruments, needles, and sponge counts were correct x 2. The patient was taken to the recovery room in stable condition.   I performed this procedure in its entirety.  ---- Larey Days, MD Attending Obstetrician and Gynecologist [facility]

## 2015-11-29 LAB — SURGICAL PATHOLOGY

## 2017-01-14 ENCOUNTER — Encounter: Payer: Self-pay | Admitting: Obstetrics and Gynecology

## 2017-02-12 ENCOUNTER — Telehealth: Payer: Self-pay

## 2017-02-12 NOTE — Telephone Encounter (Signed)
Pt called stating she has an issue c med refill [phone]

## 2017-02-12 NOTE — Telephone Encounter (Signed)
Pt calling again re issue c medication refill.  Please call. [phone]

## 2017-02-13 NOTE — Telephone Encounter (Signed)
Pt is schedule 02/24/17

## 2017-02-24 ENCOUNTER — Encounter: Payer: Self-pay | Admitting: Obstetrics and Gynecology

## 2017-02-24 ENCOUNTER — Ambulatory Visit (INDEPENDENT_AMBULATORY_CARE_PROVIDER_SITE_OTHER): Payer: Self-pay | Admitting: Obstetrics and Gynecology

## 2017-02-24 VITALS — BP 118/76 | Ht 62.0 in | Wt 188.0 lb

## 2017-02-24 DIAGNOSIS — Z79890 Hormone replacement therapy (postmenopausal): Secondary | ICD-10-CM

## 2017-02-24 DIAGNOSIS — N83201 Unspecified ovarian cyst, right side: Secondary | ICD-10-CM

## 2017-02-24 DIAGNOSIS — N83202 Unspecified ovarian cyst, left side: Secondary | ICD-10-CM

## 2017-02-24 DIAGNOSIS — E8940 Asymptomatic postprocedural ovarian failure: Secondary | ICD-10-CM

## 2017-02-24 MED ORDER — NORGESTIMATE-ETH ESTRADIOL 0.25-35 MG-MCG PO TABS: 1.0000 | ORAL_TABLET | Freq: Every day | ORAL | 3 refills | Status: DC

## 2017-02-24 NOTE — Progress Notes (Signed)
Obstetrics & Gynecology Office Visit   Chief Complaint  Patient presents with  . Follow-up    medication follow up    History of Present Illness: 36 y.o. 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  At her last visit in 07/2016, she was changed from an HRT level dosing to a very low-dose combined OCP regimen with lo loestrin.  She states that she took lo loestrin but had bleeding the last week of the pack.  She only took it for 2 months due to cost issues (she was given two sample packs).  She then went back to taking estradiol 2mg  and provera.  She states that with these she had nearly constant bleeding, with only a few days off from bleeding (never heavy).  She did pass occasional clots.  She also notes frequent hot flashes, mood swings, difficulty sleeping.  She did not contact my office once she realized she could not afford lo loestrin, as I had asked her to do.    Review of Systems: Review of Systems  Constitutional: Negative for chills, fever, malaise/fatigue and weight loss.       + hot flashes  HENT: Negative.   Eyes: Negative.   Respiratory: Negative.   Cardiovascular: Negative.   Gastrointestinal: Negative.   Genitourinary: Negative for dysuria, flank pain, frequency, hematuria and urgency.       Irregular menses  Musculoskeletal: Negative.   Skin: Negative.   Neurological: Negative.   Endo/Heme/Allergies: Negative.   Psychiatric/Behavioral:       +Frequent mood swings    Past Medical History:  Diagnosis Date  . Ovarian cyst     Past Surgical History:  Procedure Laterality Date  . CHOLECYSTECTOMY    . DIAGNOSTIC LAPAROSCOPY WITH REMOVAL OF ECTOPIC PREGNANCY Bilateral 11/26/2015   Procedure: DIAGNOSTIC LAPAROSCOPY ;  Surgeon: Honor Loh Ward, MD;  Location: [facility];  Service: Gynecology;  Laterality: Bilateral;  . LAPAROSCOPIC SALPINGO OOPHERECTOMY Bilateral 11/26/2015   Procedure: LAPAROSCOPIC SALPINGO OOPHORECTOMY;  Surgeon: Honor Loh Ward, MD;  Location: [facility];  Service: Gynecology;  Laterality: Bilateral;  . UMBILICAL HERNIA REPAIR  11/26/2015   Procedure: HERNIA REPAIR UMBILICAL ADULT;  Surgeon: Honor Loh Ward, MD;  Location: [facility];  Service: Gynecology;;    Gynecologic History: Patient's last menstrual period was 02/16/2017.  Obstetric History: [id number]  Family History: Denies family history of gynecologic cancer.   Social History:  Social History   Social History  . Marital status: Single    Spouse name: N/A  . Number of children: N/A  . Years of education: N/A   Occupational History  . Not on file.   Social History Main Topics  . Smoking status: Never Smoker  . Smokeless tobacco: Never Used  . Alcohol use No  . Drug use: No  . Sexual activity: Not on file   Other Topics Concern  . Not on file   Social History Narrative  . No narrative on file    Allergies:  No Known Allergies  Medications: Prior to Admission medications   Medication Sig Start Date End Date Taking? Authorizing Provider  estradiol (ESTRACE) 0.5 MG tablet Take 1 tablet (0.5 mg total) by mouth daily. 11/27/15  Yes Chelsea C Ward, MD  medroxyPROGESTERone (PROVERA) 2.5 MG tablet Take 1 tablet (2.5 mg total) by mouth daily. 11/27/15  Yes Honor Loh Ward, MD    Physical Exam BP 118/76   Ht 5\' 2"  (  1.575 m)   Wt 188 lb (85.3 kg)   LMP 02/16/2017   BMI 34.39 kg/m   Patient's last menstrual period was 02/16/2017.  General: NAD HEENT: normocephalic, anicteric Thyroid: no enlargement, no palpable nodules Pulmonary: No increased work of breathing Cardiovascular: RRR, distal pulses 2+ Abdomen: NABS, soft, non-tender, non-distended.  Umbilicus without lesions.  No hepatomegaly, splenomegaly or masses palpable. No evidence of hernia  Extremities: no edema, erythema, or tenderness Neurologic: Grossly intact Psychiatric: mood appropriate, affect  full   Assessment: 36 y.o. 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  Plan: Will attempt regular dose combined OCPs.  She understands that it may take a couple of months to get her hot flushes and other symptoms to go away. She was given the usual warning regarding estrogen use, including increased risk of venous thromboembolism.   Return in about 3 months (around 05/26/2017) for follow up medication.   Hospital-provided spanish language interpreter present during entire interview.   Prentice Docker, MD 02/24/2017 8:00 PM

## 2017-03-25 ENCOUNTER — Encounter: Payer: Self-pay | Admitting: Obstetrics and Gynecology

## 2017-05-26 ENCOUNTER — Ambulatory Visit (INDEPENDENT_AMBULATORY_CARE_PROVIDER_SITE_OTHER): Payer: Self-pay | Admitting: Obstetrics and Gynecology

## 2017-05-26 ENCOUNTER — Encounter: Payer: Self-pay | Admitting: Obstetrics and Gynecology

## 2017-05-26 VITALS — BP 102/68 | HR 63 | Ht 62.0 in | Wt 198.0 lb

## 2017-05-26 DIAGNOSIS — N921 Excessive and frequent menstruation with irregular cycle: Secondary | ICD-10-CM

## 2017-05-26 DIAGNOSIS — E8941 Symptomatic postprocedural ovarian failure: Secondary | ICD-10-CM

## 2017-05-26 DIAGNOSIS — N920 Excessive and frequent menstruation with regular cycle: Secondary | ICD-10-CM | POA: Insufficient documentation

## 2017-05-26 MED ORDER — LEVONORGEST-ETH ESTRAD 91-DAY 0.15-0.03 &0.01 MG PO TABS: 1.0000 | ORAL_TABLET | Freq: Every day | ORAL | 4 refills | Status: AC

## 2017-05-26 NOTE — Progress Notes (Signed)
Obstetrics & Gynecology Office Visit   Chief Complaint  Patient presents with  . Follow-up   History of Present Illness: 36 y.o. G41P2002 female here for follow up of hormonal management of her surgical menopause symptoms.  With a regular dose oral contraceptive the patient has bleeding only during the placebo portion of the pack. However throughout the month she continues to have hot flashes, mood swings, difficulty sleeping. She denies any other symptoms.   Past Medical History:  Diagnosis Date  . Ovarian cyst     Past Surgical History:  Procedure Laterality Date  . CHOLECYSTECTOMY    . DIAGNOSTIC LAPAROSCOPY WITH REMOVAL OF ECTOPIC PREGNANCY Bilateral 11/26/2015   Procedure: DIAGNOSTIC LAPAROSCOPY ;  Surgeon: Honor Loh Ward, MD;  Location: [facility];  Service: Gynecology;  Laterality: Bilateral;  . LAPAROSCOPIC SALPINGO OOPHERECTOMY Bilateral 11/26/2015   Procedure: LAPAROSCOPIC SALPINGO OOPHORECTOMY;  Surgeon: Honor Loh Ward, MD;  Location: [facility];  Service: Gynecology;  Laterality: Bilateral;  . UMBILICAL HERNIA REPAIR  11/26/2015   Procedure: HERNIA REPAIR UMBILICAL ADULT;  Surgeon: Honor Loh Ward, MD;  Location: [facility];  Service: Gynecology;;    Gynecologic History: Patient's last menstrual period was 05/19/2017.  Obstetric History: [id number]  No family history on file.  Social History   Social History  . Marital status: Single    Spouse name: N/A  . Number of children: N/A  . Years of education: N/A   Occupational History  . Not on file.   Social History Main Topics  . Smoking status: Never Smoker  . Smokeless tobacco: Never Used  . Alcohol use No  . Drug use: No  . Sexual activity: Not on file   Other Topics Concern  . Not on file   Social History Narrative  . No narrative on file   Allergies: No Known Allergies    Medication Sig Start Date End Date Taking? Authorizing Provider  acetaminophen (TYLENOL) 325 MG tablet Take 2 tablets (650 mg total) by  mouth every 4 (four) hours. 11/27/15   Ward, Honor Loh, MD  norgestimate-ethinyl estradiol (Truman 28) 0.25-35 MG-MCG tablet Take 1 tablet by mouth daily. 02/24/17 05/19/17  Will Bonnet, MD   Review of Systems  Constitutional:       Hot flashes, trouble sleeping.   Respiratory: Negative.   Cardiovascular: Negative.   Gastrointestinal: Negative.   Genitourinary: Negative.   Musculoskeletal: Negative.   Skin: Negative.   Neurological: Negative.   Psychiatric/Behavioral: Negative.      Physical Exam BP 102/68   Pulse 63   Ht 5\' 2"  (1.575 m)   Wt 198 lb (89.8 kg)   LMP 05/19/2017   BMI 36.21 kg/m  Patient's last menstrual period was 05/19/2017. Physical Exam  Constitutional: She appears well-developed and well-nourished. No distress.  Psychiatric: She has a normal mood and affect. Her behavior is normal. Judgment normal.   Assessment: 36 y.o. D3O6712 female No problem-specific Assessment & Plan notes found for this encounter.   Plan: Problem List Items Addressed This Visit    Hot flashes due to surgical menopause - Primary   Relevant Medications   Levonorgestrel-Ethinyl Estradiol (AMETHIA,CAMRESE) 0.15-0.03 &0.01 MG tablet   Menorrhagia   Relevant Medications   Levonorgestrel-Ethinyl Estradiol (AMETHIA,CAMRESE) 0.15-0.03 &0.01 MG tablet     Will switch patient to a longer interval regular dose contraceptive medication with 0 days of medication without estrogen. Follow-up or months to assess response.  20 minutes spent in face to face discussion with > 50% spent in  counseling and management of her menorrhagia and hypoestrogen symptoms.   Prentice Docker, MD 05/26/2017 1:58 PM

## 2017-08-08 ENCOUNTER — Encounter: Payer: Self-pay | Admitting: Obstetrics and Gynecology

## 2017-08-15 ENCOUNTER — Encounter: Payer: Self-pay | Admitting: Obstetrics and Gynecology

## 2017-08-18 ENCOUNTER — Encounter: Payer: Self-pay | Admitting: Obstetrics and Gynecology

## 2017-08-25 ENCOUNTER — Encounter: Payer: Self-pay | Admitting: Obstetrics and Gynecology

## 2017-08-26 ENCOUNTER — Encounter: Payer: Self-pay | Admitting: Obstetrics and Gynecology

## 2017-09-02 ENCOUNTER — Other Ambulatory Visit: Payer: Self-pay | Admitting: Obstetrics and Gynecology

## 2017-09-02 DIAGNOSIS — E349 Endocrine disorder, unspecified: Secondary | ICD-10-CM

## 2017-09-03 ENCOUNTER — Ambulatory Visit (INDEPENDENT_AMBULATORY_CARE_PROVIDER_SITE_OTHER): Payer: Self-pay | Admitting: Obstetrics and Gynecology

## 2017-09-03 ENCOUNTER — Ambulatory Visit (INDEPENDENT_AMBULATORY_CARE_PROVIDER_SITE_OTHER): Payer: Self-pay

## 2017-09-03 VITALS — BP 124/78 | Wt 198.0 lb

## 2017-09-03 DIAGNOSIS — E349 Endocrine disorder, unspecified: Secondary | ICD-10-CM

## 2017-09-03 DIAGNOSIS — E8941 Symptomatic postprocedural ovarian failure: Secondary | ICD-10-CM

## 2017-09-03 DIAGNOSIS — N921 Excessive and frequent menstruation with irregular cycle: Secondary | ICD-10-CM

## 2017-09-03 NOTE — Progress Notes (Signed)
Gynecology Ultrasound Follow Up   Chief Complaint  Patient presents with  . Follow-up  Abnormal menses on hormonal contraception   History of Present Illness: Patient is a 36 y.o. female who presents today for ultrasound evaluation of abnormal uterine bleedig .  Ultrasound demonstrates the following findings Adnexa: surgically absent, no masses seen Uterus: anteverted with endometrial stripe  5.1 mm Additional: no other pathologic changes noted.  Notably, this is a patient who is status post bilateral salpingo-oophorectomy early last year.  In that time it has been quite difficult to control her hypo-estrogen symptoms as well as her uterine bleeding.  She was on extended cycle combined oral contraceptives since her last visit.  However she had heavy bleeding requiring the use of a diaper, according to her.  She states that she stopped using the contraception medication about 1.5 weeks ago and her symptoms have improved.  She is no longer bleeding and she does not have hot flashes.  Past Medical History:  Diagnosis Date  . Ovarian cyst     Past Surgical History:  Procedure Laterality Date  . CHOLECYSTECTOMY    . DIAGNOSTIC LAPAROSCOPY WITH REMOVAL OF ECTOPIC PREGNANCY Bilateral 11/26/2015   Procedure: DIAGNOSTIC LAPAROSCOPY ;  Surgeon: Honor Loh Ward, MD;  Location: [facility];  Service: Gynecology;  Laterality: Bilateral;  . LAPAROSCOPIC SALPINGO OOPHERECTOMY Bilateral 11/26/2015   Procedure: LAPAROSCOPIC SALPINGO OOPHORECTOMY;  Surgeon: Honor Loh Ward, MD;  Location: [facility];  Service: Gynecology;  Laterality: Bilateral;  . UMBILICAL HERNIA REPAIR  11/26/2015   Procedure: HERNIA REPAIR UMBILICAL ADULT;  Surgeon: Honor Loh Ward, MD;  Location: [facility];  Service: Gynecology;;     No family history on file.  Social History   Social History  . Marital status: Single    Spouse name: N/A  . Number of children: N/A  . Years of education: N/A   Occupational History  . Not on  file.   Social History Main Topics  . Smoking status: Never Smoker  . Smokeless tobacco: Never Used  . Alcohol use No  . Drug use: No  . Sexual activity: Not on file   Other Topics Concern  . Not on file   Social History Narrative  . No narrative on file    No Known Allergies  Prior to Admission medications   Medication Sig Start Date End Date Taking? Authorizing Provider  acetaminophen (TYLENOL) 325 MG tablet Take 2 tablets (650 mg total) by mouth every 4 (four) hours. 11/27/15   Ward, Honor Loh, MD  Levonorgestrel-Ethinyl Estradiol (AMETHIA,CAMRESE) 0.15-0.03 &0.01 MG tablet Take 1 tablet by mouth daily. 05/26/17   Will Bonnet, MD    Physical Exam BP 124/78   Wt 198 lb (89.8 kg)   BMI 36.21 kg/m    General: NAD HEENT: normocephalic, anicteric Pulmonary: No increased work of breathing Extremities: no edema, erythema, or tenderness Neurologic: Grossly intact, normal gait Psychiatric: mood appropriate, affect full  Endometrial Biopsy After discussion with the patient regarding her abnormal uterine bleeding I recommended that she proceed with an endometrial biopsy for further diagnosis. The risks, benefits, alternatives, and indications for an endometrial biopsy were discussed with the patient in detail. She understood the risks including infection, bleeding, cervical laceration and uterine perforation.  Verbal consent was obtained.   PROCEDURE NOTE:  Pipelle endometrial biopsy was performed using aseptic technique with iodine preparation.  The uterus was sounded to a length of 8 cm.  Adequate sampling was obtained with minimal blood loss.  The patient tolerated the procedure well.  Disposition will be pending pathology.  Assessment: 36 y.o. G2P2002  1. Hot flashes due to surgical menopause   2. Menorrhagia with irregular cycle     Plan: Problem List Items Addressed This Visit    Hot flashes due to surgical menopause - Primary   Menorrhagia   Relevant Orders    CBC with Differential/Platelet   Von Willebrand panel   Pathology     She has not responded at all to any form of oral hormonal replacement medication in terms of symptoms of hypoestrogenemia and in terms of bleeding.  Today, I performed an endometrial biopsy.  As she has not responded to hormonal manipulation of her periods, will test her for von Willebrand's disease and will check a CBC.  If these tests are normal, we discussed control of bleeding using an IUD.  She states that she has used an IUD in the past and this did not work well for her and she was quite uncomfortable while using it.  Therefore, she requests hysterectomy.  I believe this is a reasonable option given that she has been trying medications for the past 1.5 years without success.  15 minutes spent in face to face discussion with > 50% spent in counseling, management, coordination of care of her hypoestrogenemia symptoms and menorrhagia with irregular symptoms.    A medical spanish interpreter was present throughout her visit.   Prentice Docker, MD 09/03/2017 11:58 AM

## 2017-09-04 LAB — CBC WITH DIFFERENTIAL/PLATELET
BASOS ABS: 0 10*3/uL (ref 0.0–0.2)
Basos: 0 %
EOS (ABSOLUTE): 0.3 10*3/uL (ref 0.0–0.4)
Eos: 4 %
HEMOGLOBIN: 13.1 g/dL (ref 11.1–15.9)
Hematocrit: 40.8 % (ref 34.0–46.6)
Immature Grans (Abs): 0 10*3/uL (ref 0.0–0.1)
Immature Granulocytes: 0 %
LYMPHS ABS: 2.7 10*3/uL (ref 0.7–3.1)
Lymphs: 34 %
MCH: 28.4 pg (ref 26.6–33.0)
MCHC: 32.1 g/dL (ref 31.5–35.7)
MCV: 89 fL (ref 79–97)
MONOCYTES: 8 %
MONOS ABS: 0.7 10*3/uL (ref 0.1–0.9)
NEUTROS ABS: 4.3 10*3/uL (ref 1.4–7.0)
Neutrophils: 54 %
Platelets: 340 10*3/uL (ref 150–379)
RBC: 4.61 x10E6/uL (ref 3.77–5.28)
RDW: 12.9 % (ref 12.3–15.4)
WBC: 8 10*3/uL (ref 3.4–10.8)

## 2017-09-08 LAB — PATHOLOGY

## 2017-09-29 ENCOUNTER — Ambulatory Visit: Payer: Self-pay | Admitting: Obstetrics and Gynecology

## 2018-05-20 ENCOUNTER — Other Ambulatory Visit: Payer: Self-pay

## 2018-05-20 ENCOUNTER — Ambulatory Visit
Admission: RE | Admit: 2018-05-20 | Discharge: 2018-05-20 | Disposition: A | Discharge status: Home / Self Care | Payer: Self-pay | Source: Ambulatory Visit | Attending: Oncology | Admitting: Oncology

## 2018-05-20 ENCOUNTER — Encounter: Payer: Self-pay | Admitting: *Deleted

## 2018-05-20 ENCOUNTER — Ambulatory Visit: Payer: Self-pay | Attending: Oncology | Admitting: *Deleted

## 2018-05-20 VITALS — BP 113/76 | HR 60 | Temp 98.3°F

## 2018-05-20 DIAGNOSIS — N6459 Other signs and symptoms in breast: Secondary | ICD-10-CM

## 2018-05-20 NOTE — Progress Notes (Addendum)
  Subjective:     Patient ID: Brittany Torres, female   DOB: 12-14-1980, 37 y.o.   MRN: [id number]  37 year old Hispanic patient referred to [place] by Dr. Ladoris Gene at the [facility] for further evaluation of a left breast mass at 12:00.  Lloyda, the interpreter present during the interview and exam.    Review of Systems     Objective:   Physical Exam  Pulmonary/Chest: Right breast exhibits tenderness. Right breast exhibits no inverted nipple, no mass, no nipple discharge and no skin change. Left breast exhibits tenderness. Left breast exhibits no inverted nipple, no mass, no nipple discharge and no skin change.         Assessment:     Patient states she noticed bilateral breast masses about 3 months ago.  States they were tender so she went to her doctor for further exam.  On clinical breast exam bilateral breast have approximate 4 cm mobile, tender thickening at the upper outer quadrants to the areola.  Patient does state she drinks a thermos of coffee per day.  Had been on estrogen therapy, but was taken off due to heavy bleeding.   Patient with a history of bilateral salipingo-opherectomy for ectopic pregnancy and ovarian cysts.  Taught self breast awarness.  Last pap was in 2016.  Patient should follow-up with her primary care for routine pap screening.   Patient has been screened for eligibility.  She does not have any insurance, Medicare or Medicaid.  She also meets financial eligibility.  Hand-out given on the Affordable Care Act. Risk Assessment    Risk Scores      05/20/2018   Last edited by: Theodore Demark, RN   5-year risk: 0.4 %   Lifetime risk: 8.7 %            Plan:     Will get bilateral diagnostic mammogram and ultrasound.  Explained to patient that if no findings on imaging I will have her return for repeat breast exam in a couple of months.  She is to decrease caffeine intake also.  Will follow-up per [facility] protocol

## 2018-05-20 NOTE — Patient Instructions (Signed)
Gave patient hand-out, Women Staying Healthy, Active and Well from [facility], with education on breast health, pap smears, heart and colon health. 

## 2018-05-27 ENCOUNTER — Encounter: Payer: Self-pay | Admitting: *Deleted

## 2018-05-27 NOTE — Progress Notes (Signed)
Patient with normal mammogram.  Letter mailed to inform patient of appointment to return on 08/17/18 @ 11:30 for repeat breast exam.  HSIS to [place].

## 2018-08-17 ENCOUNTER — Ambulatory Visit: Payer: Self-pay | Attending: Oncology | Admitting: *Deleted

## 2018-08-17 DIAGNOSIS — N6459 Other signs and symptoms in breast: Secondary | ICD-10-CM

## 2018-08-17 NOTE — Progress Notes (Signed)
  Subjective:     Patient ID: Brittany Torres, female   DOB: 23-Jun-1981, 37 y.o.   MRN: [id number]  HPI   Review of Systems     Objective:   Physical Exam  Pulmonary/Chest:         Assessment:     37 year old Hispanic patient returns for repeat clinical breast exam.  [facility] M[facility] [phone] used for interpretation.  Patient was seen in July as a referral for a left breast mass.  No mass palpated at that time, but bilateral breast had thickening in the upper outer quadrants.  No findings on imaging.  Patient does have dense breast tissue noted on imaging.  Patient was encouraged to return in 3 months for repeat breast exam.  On clinical breast exam today there is no dominant mass, skin changes, nipple discharge or lymphadenopathy.  There is again noteable thickening at bilateral upper outer quadrants.  Reviewed findings with patient.  Taught self breast awareness.     Plan:     Since there were no findings clinically in July and again on today's exam, and no findings on imaging, I have encouraged the patient to return for screening mammogram at the age of 37.  She will be 37 in about 15 months.  She is agreeable to the plan.

## 2020-02-05 ENCOUNTER — Ambulatory Visit: Payer: MEDICAID | Attending: Internal Medicine

## 2020-02-05 DIAGNOSIS — Z23 Encounter for immunization: Secondary | ICD-10-CM

## 2020-02-05 NOTE — Progress Notes (Signed)
   Covid-19 Vaccination Clinic  Name:  Brittany Torres    MRN: [id number] DOB: 1981-04-09  02/05/2020  Ms. Brittany Torres was observed post Covid-19 immunization for 15 minutes without incident. She was provided with Vaccine Information Sheet and instruction to access the V-Safe system.   Ms. Brittany Torres was instructed to call 911 with any severe reactions post vaccine: Marland Kitchen Difficulty breathing  . Swelling of face and throat  . A fast heartbeat  . A bad rash all over body  . Dizziness and weakness   Immunizations Administered    Name Date Dose VIS Date Route   Pfizer COVID-19 Vaccine 02/05/2020  9:03 AM 0.3 mL 10/22/2019 Intramuscular   Manufacturer: [place]   Lot: [id number]   [place]: [id number]

## 2020-02-26 ENCOUNTER — Ambulatory Visit: Payer: MEDICAID | Attending: Internal Medicine

## 2020-02-26 DIAGNOSIS — Z23 Encounter for immunization: Secondary | ICD-10-CM

## 2020-02-26 NOTE — Progress Notes (Signed)
   Covid-19 Vaccination Clinic  Name:  Brittany Torres    MRN: [id number] DOB: 1981-06-28  02/26/2020  Brittany Torres was observed post Covid-19 immunization for 15 minutes   without incident. She was provided with Vaccine Information Sheet and instruction to access the V-Safe system.   Brittany Torres was instructed to call 911 with any severe reactions post vaccine: Marland Kitchen Difficulty breathing  . Swelling of face and throat  . A fast heartbeat  . A bad rash all over body  . Dizziness and weakness   Immunizations Administered    Name Date Dose VIS Date Route   Pfizer COVID-19 Vaccine 02/26/2020  9:00 AM 0.3 mL 10/22/2019 Intramuscular   Manufacturer: [place]   Lot: [id number]   [place]: [id number]

## 2021-09-13 ENCOUNTER — Encounter: Payer: Self-pay | Admitting: Family Medicine

## 2021-10-24 ENCOUNTER — Other Ambulatory Visit: Payer: Self-pay

## 2021-10-24 ENCOUNTER — Ambulatory Visit: Payer: MEDICAID | Attending: Oncology | Admitting: *Deleted

## 2021-10-24 ENCOUNTER — Ambulatory Visit
Admission: RE | Admit: 2021-10-24 | Discharge: 2021-10-24 | Disposition: A | Discharge status: Home / Self Care | Payer: Self-pay | Source: Ambulatory Visit | Attending: Oncology | Admitting: Oncology

## 2021-10-24 DIAGNOSIS — Z0000 Encounter for general adult medical examination without abnormal findings: Secondary | ICD-10-CM | POA: Insufficient documentation

## 2021-10-24 NOTE — Progress Notes (Signed)
Patient pre-screened for BCCCP eligibility due to COVID 19 precautions. Two patient identifiers used for verification that I was speaking to correct patient.  Patient to Present directly to [facility] today for BCCCP screening mammogram.

## 2021-10-29 ENCOUNTER — Encounter: Payer: Self-pay | Admitting: *Deleted

## 2021-10-29 NOTE — Progress Notes (Signed)
Letter mailed from the [facility] to inform patient of her normal mammogram results.  Patient is to follow-up with annual screening in one year. 

## 2022-03-24 IMAGING — MG MM DIGITAL SCREENING BILAT W/ TOMO AND CAD
6 of 10 series · 6 of 30 positions shown · non-contrast
Comparison: Previous exam(s).

CLINICAL DATA: Screening.

EXAM:
DIGITAL SCREENING BILATERAL MAMMOGRAM WITH TOMOSYNTHESIS AND CAD
TECHNIQUE: Bilateral screening digital craniocaudal and mediolateral oblique
mammograms were obtained. Bilateral screening digital breast
tomosynthesis was performed. The images were evaluated with
computer-aided detection.

[R CC synth-2D]
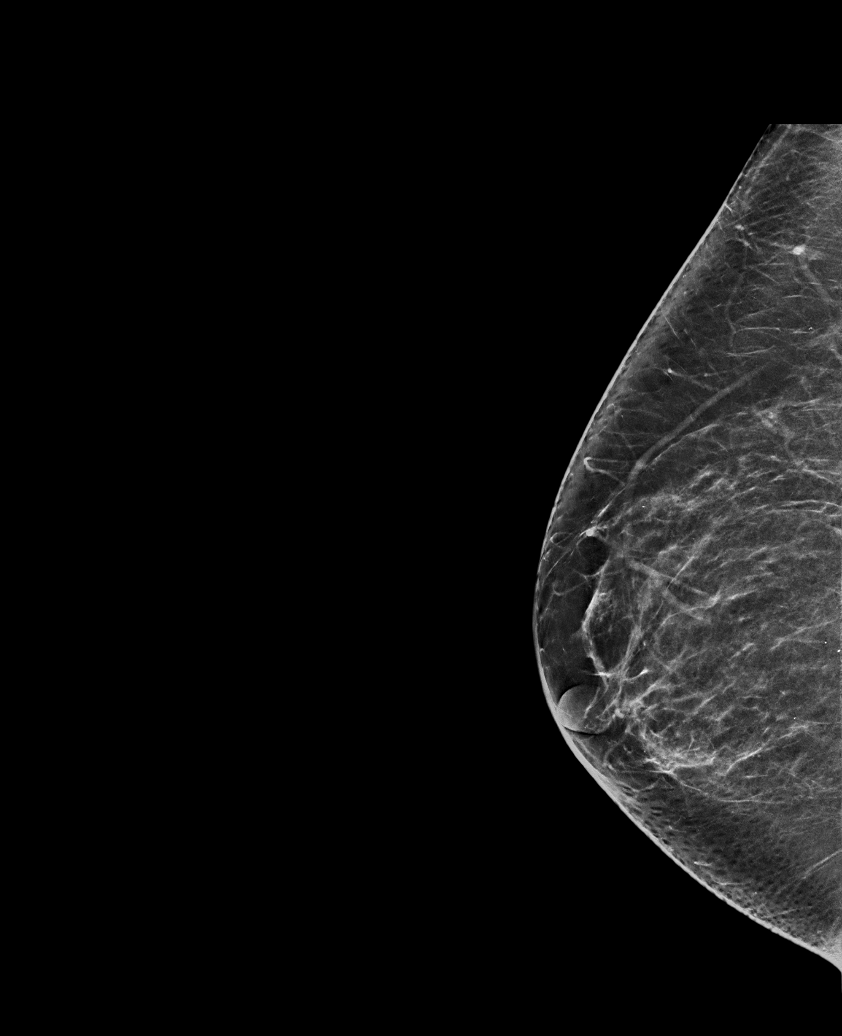

[L MLO synth-2D (1 of 2)]
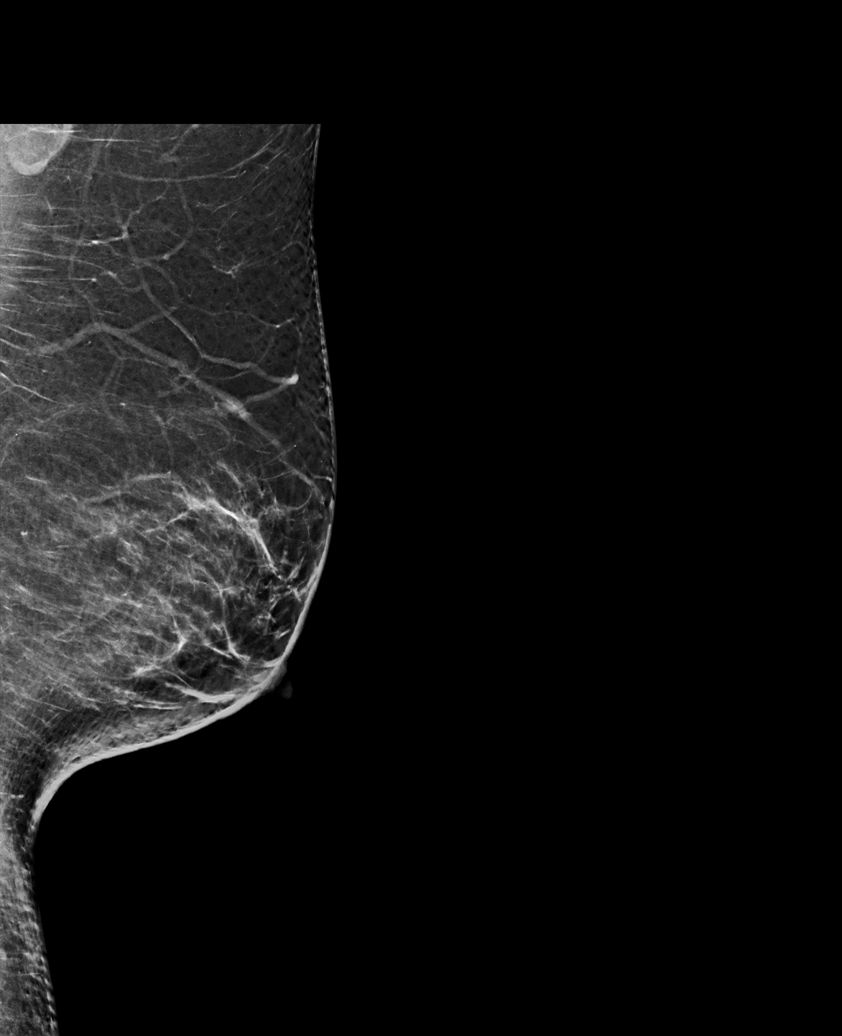

[L CC synth-2D]
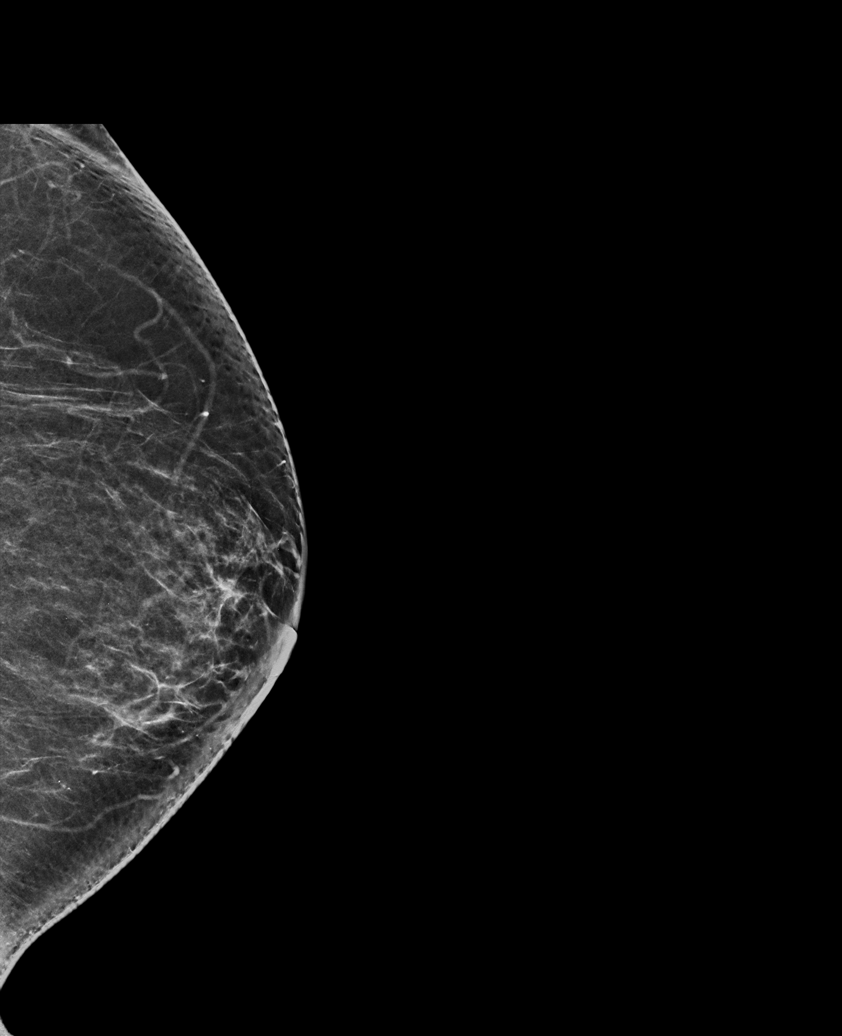

[L MLO synth-2D (2 of 2)]
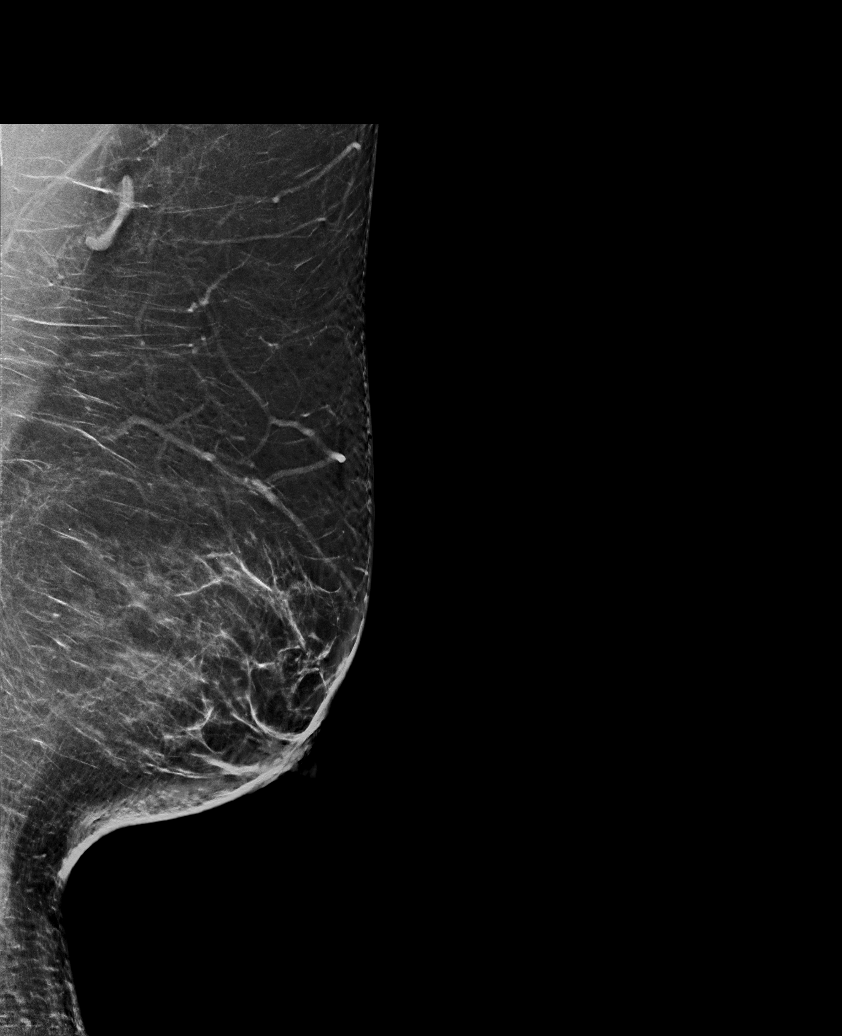

[R MLO synth-2D]
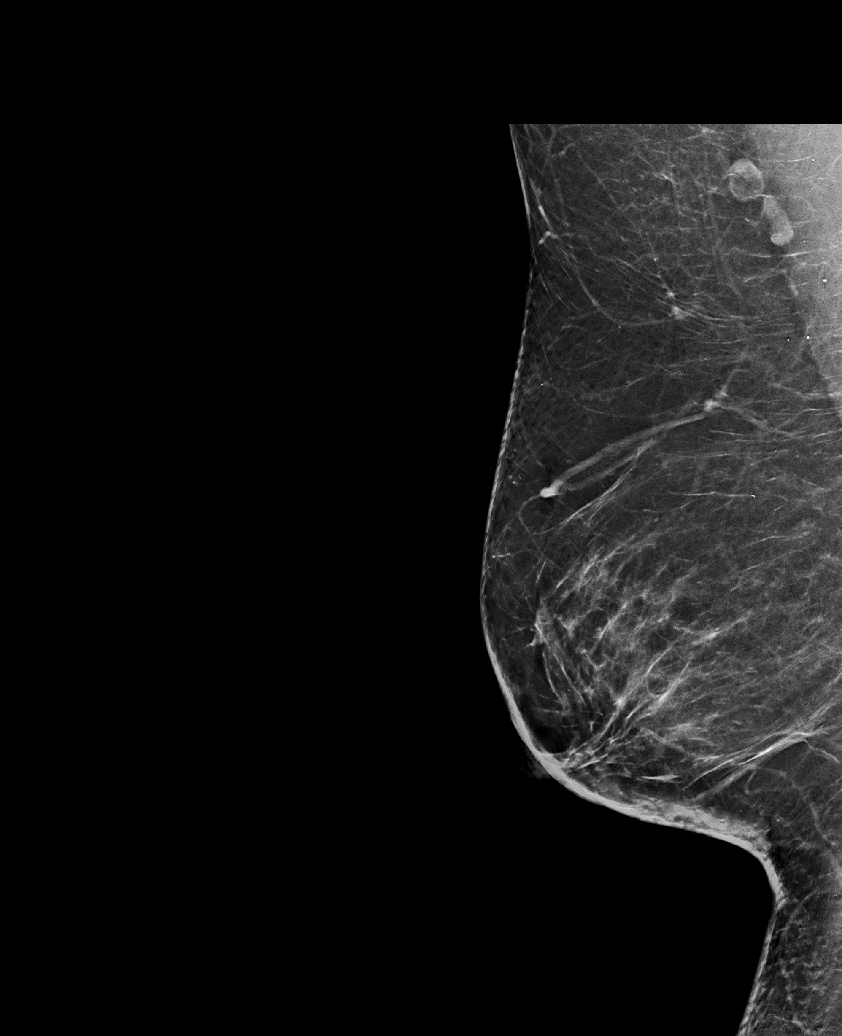

[L MLO tomo · tomo slice 41/82.0]
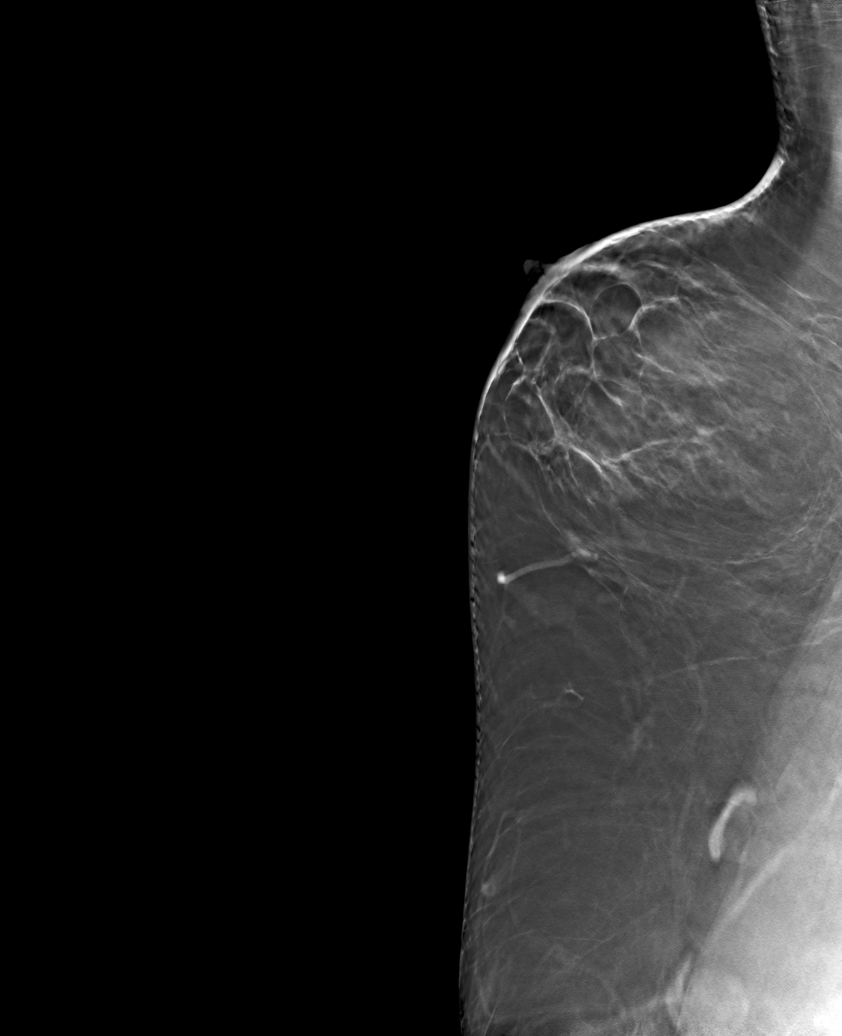

[6 of 30 positions shown; findings below may reference images not displayed]

ACR Breast Density Category b: There are scattered areas of
fibroglandular density.
FINDINGS: There are no findings suspicious for malignancy.
IMPRESSION: No mammographic evidence of malignancy. A result letter of this
screening mammogram will be mailed directly to the patient.

RECOMMENDATION:
Screening mammogram in one year. (Code:51-O-LD2)

BI-RADS CATEGORY  1: Negative.

## 2023-01-20 ENCOUNTER — Encounter: Payer: Self-pay | Admitting: Family Medicine

## 2023-01-22 ENCOUNTER — Other Ambulatory Visit: Payer: Self-pay

## 2023-01-22 DIAGNOSIS — Z1231 Encounter for screening mammogram for malignant neoplasm of breast: Secondary | ICD-10-CM

## 2023-02-17 ENCOUNTER — Ambulatory Visit: Payer: MEDICAID | Attending: Hematology and Oncology | Admitting: Hematology and Oncology

## 2023-02-17 ENCOUNTER — Other Ambulatory Visit: Payer: Self-pay

## 2023-02-17 ENCOUNTER — Ambulatory Visit: Payer: Self-pay

## 2023-02-17 VITALS — BP 130/74 | Wt 200.3 lb

## 2023-02-17 DIAGNOSIS — N6310 Unspecified lump in the right breast, unspecified quadrant: Secondary | ICD-10-CM

## 2023-02-17 DIAGNOSIS — N6320 Unspecified lump in the left breast, unspecified quadrant: Secondary | ICD-10-CM

## 2023-02-17 DIAGNOSIS — N6311 Unspecified lump in the right breast, upper outer quadrant: Secondary | ICD-10-CM

## 2023-02-17 NOTE — Progress Notes (Signed)
Brittany Torres is a 42 y.o. female who presents to [facility] clinic today with complaint of right breast mass.    Pap Smear: Pap not smear completed today. Last Pap smear was 2024 at [facility] clinic and was. Per patient has no history of an abnormal Pap smear. Last Pap smear result is available in Epic. Recommend repeat Pap smear next year.    Physical exam: Breasts Breasts symmetrical. No skin abnormalities bilateral breasts. No nipple retraction bilateral breasts. No nipple discharge bilateral breasts. No lymphadenopathy. Right breast lump noted to the upper, outer quadrant and firmness noted to the left breast, upper outer quadrant.  MS DIGITAL SCREENING TOMO BILATERAL  Result Date: 10/25/2021 CLINICAL DATA:  Screening. EXAM: DIGITAL SCREENING BILATERAL MAMMOGRAM WITH TOMOSYNTHESIS AND CAD TECHNIQUE: Bilateral screening digital craniocaudal and mediolateral oblique mammograms were obtained. Bilateral screening digital breast tomosynthesis was performed. The images were evaluated with computer-aided detection. COMPARISON:  Previous exam(s). ACR Breast Density Category b: There are scattered areas of fibroglandular density. FINDINGS: There are no findings suspicious for malignancy. IMPRESSION: No mammographic evidence of malignancy. A result letter of this screening mammogram will be mailed directly to the patient. RECOMMENDATION: Screening mammogram in one year. (Code:SM-B-01Y) BI-RADS CATEGORY  1: Negative. Electronically Signed   By: Brittany Torres M.D.   On: 10/25/2021 12:59  MS DIGITAL DIAG TOMO BILAT  Result Date: 05/20/2018 CLINICAL DATA:  Bilateral upper outer quadrant tenderness and areas of palpable concern. EXAM: DIGITAL DIAGNOSTIC BILATERAL MAMMOGRAM WITH CAD AND TOMO ULTRASOUND BILATERAL BREAST COMPARISON:  Baseline mammogram. ACR Breast Density Category c: The breast tissue is heterogeneously dense, which may obscure small masses. FINDINGS: Mammographically, there are no  suspicious masses, areas of architectural distortion or microcalcifications in either breast. Mammographic images were processed with CAD. On physical exam, no suspicious masses are palpated. Targeted bilateral upper outer quadrants breast ultrasound is performed, showing no suspicious masses or shadowing lesions. IMPRESSION: No mammographic sonographic evidence of malignancy in either breast. RECOMMENDATION: Further management of patient's breast tenderness and areas of palpable concern should be based on clinical grounds. Screening mammogram at age 19 unless there are persistent or intervening clinical concerns. (Code:SM-B-40A) I have discussed the findings and recommendations with the patient. Results were also provided in writing at the conclusion of the visit. If applicable, a reminder letter will be sent to the patient regarding the next appointment. BI-RADS CATEGORY  1: Negative. Electronically Signed   By: Brittany Torres M.D.   On: 05/20/2018 10:18          Pelvic/Bimanual Pap is not indicated today    Smoking History: Patient has never smoked and was not referred to quit line.    Patient Navigation: Patient education provided. Access to services provided for patient through [facility] program. No interpreter provided. No transportation provided   Colorectal Cancer Screening: Per patient has never had colonoscopy completed No complaints today.    Breast and Cervical Cancer Risk Assessment: Patient does not have family history of breast cancer, known genetic mutations, or radiation treatment to the chest before age 7. Patient does not have history of cervical dysplasia, immunocompromised, or DES exposure in-utero.  Risk Assessment   No risk assessment data for the current encounter  Risk Scores       10/24/2021   Last edited by: Brittany Presto, RN   5-year risk: 0.5 %   Lifetime risk: 8.5 %            A: BCCCP exam without pap  smear Complaint of right breast mass. Right  breast lump noted in the upper, outer quadrant as well as a firmness noted in the left upper outer quadrant.   P: Referred patient to the [facility] for a diagnostic mammogram. Appointment scheduled.  Brittany Lux, NP 02/17/2023 9:57 AM

## 2023-02-17 NOTE — Patient Instructions (Signed)
Taught [employer] about self breast awareness and gave educational materials to take home. Patient did not need a Pap smear today due to last Pap smear was in 2024 per patient. Let her know BCCCP will cover Pap smears every 5 years unless has a history of abnormal Pap smears. Referred patient to the [facility] for diagnostic mammogram. Appointment scheduled for 02/17/23. Patient aware of appointment and will be there. Let patient know will follow up with her within the next couple weeks with results. Brittany Torres verbalized understanding.  [name], NP 9:58 AM

## 2023-02-24 ENCOUNTER — Ambulatory Visit
Admission: RE | Admit: 2023-02-24 | Discharge: 2023-02-24 | Disposition: A | Discharge status: Home / Self Care | Payer: Self-pay | Source: Ambulatory Visit | Attending: Obstetrics and Gynecology | Admitting: Obstetrics and Gynecology

## 2023-02-24 DIAGNOSIS — N6320 Unspecified lump in the left breast, unspecified quadrant: Secondary | ICD-10-CM

## 2023-02-24 DIAGNOSIS — N6310 Unspecified lump in the right breast, unspecified quadrant: Secondary | ICD-10-CM | POA: Insufficient documentation
